# Patient Record
Sex: Female | Born: 2000 | Race: White | Hispanic: No | Smoking: Former smoker
Health system: Southern US, Community
[De-identification: ages and names within clinical notes are randomized; demographics above are authoritative.]

## PROBLEM LIST (undated history)

## (undated) DIAGNOSIS — F329 Major depressive disorder, single episode, unspecified: Secondary | ICD-10-CM

## (undated) DIAGNOSIS — F419 Anxiety disorder, unspecified: Secondary | ICD-10-CM

## (undated) DIAGNOSIS — F32A Depression, unspecified: Secondary | ICD-10-CM

## (undated) DIAGNOSIS — R42 Dizziness and giddiness: Secondary | ICD-10-CM

## (undated) HISTORY — DX: Dizziness and giddiness: R42

---
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
## 2018-02-15 ENCOUNTER — Other Ambulatory Visit: Payer: Self-pay

## 2018-02-15 ENCOUNTER — Encounter (HOSPITAL_BASED_OUTPATIENT_CLINIC_OR_DEPARTMENT_OTHER): Payer: Self-pay | Admitting: Emergency Medicine

## 2018-02-15 DIAGNOSIS — Z79899 Other long term (current) drug therapy: Secondary | ICD-10-CM | POA: Diagnosis not present

## 2018-02-15 DIAGNOSIS — R1110 Vomiting, unspecified: Secondary | ICD-10-CM | POA: Insufficient documentation

## 2018-02-15 DIAGNOSIS — R42 Dizziness and giddiness: Secondary | ICD-10-CM | POA: Insufficient documentation

## 2018-02-15 DIAGNOSIS — N946 Dysmenorrhea, unspecified: Secondary | ICD-10-CM | POA: Insufficient documentation

## 2018-02-15 DIAGNOSIS — F17210 Nicotine dependence, cigarettes, uncomplicated: Secondary | ICD-10-CM | POA: Diagnosis not present

## 2018-02-15 DIAGNOSIS — N939 Abnormal uterine and vaginal bleeding, unspecified: Secondary | ICD-10-CM | POA: Diagnosis present

## 2018-02-15 DIAGNOSIS — R109 Unspecified abdominal pain: Secondary | ICD-10-CM | POA: Diagnosis not present

## 2018-02-15 NOTE — ED Triage Notes (Signed)
Pt presents with c/o worse than normal cramps and blood clots.

## 2018-02-16 ENCOUNTER — Emergency Department (HOSPITAL_BASED_OUTPATIENT_CLINIC_OR_DEPARTMENT_OTHER)
Admission: EM | Admit: 2018-02-16 | Discharge: 2018-02-16 | Disposition: A | Discharge status: Home / Self Care | Payer: Medicaid Other | Attending: Emergency Medicine | Admitting: Emergency Medicine

## 2018-02-16 DIAGNOSIS — N946 Dysmenorrhea, unspecified: Secondary | ICD-10-CM

## 2018-02-16 LAB — WET PREP, GENITAL
Clue Cells Wet Prep HPF POC: NONE SEEN
Sperm: NONE SEEN
Trich, Wet Prep: NONE SEEN
Yeast Wet Prep HPF POC: NONE SEEN

## 2018-02-16 LAB — PREGNANCY, URINE: PREG TEST UR: NEGATIVE

## 2018-02-16 MED ORDER — ONDANSETRON 4 MG PO TBDP: 4.0000 mg | ORAL_TABLET | Freq: Four times a day (QID) | ORAL | 0 refills | Status: DC | PRN

## 2018-02-16 MED ORDER — ONDANSETRON 4 MG PO TBDP
4.0000 mg | ORAL_TABLET | Freq: Once | ORAL | Status: DC
  Filled 2018-02-16: qty 1

## 2018-02-16 MED ORDER — IBUPROFEN 800 MG PO TABS
800.0000 mg | ORAL_TABLET | Freq: Once | ORAL | Status: AC
  Administered 2018-02-16: 800 mg via ORAL
  Filled 2018-02-16: qty 1

## 2018-02-16 NOTE — ED Provider Notes (Signed)
TIME SEEN: 1:12 AM  CHIEF COMPLAINT: Vaginal bleeding  HPI: Patient is an 17 year old female who presents emergency department with heavy vaginal bleeding and cramps.  States that she had a Nex[facility]on placed about a year ago [facility] and since that time has had very regular menstrual cycles.  States at times her menstrual cycles very light and will last only a day and then other days it is heavier and she is passing clots with lots of cramping.  She states that she began bleeding several days ago and started passing clots last night at work and felt lightheaded and had one episode of vomiting.  States she is having diffuse lower abdominal cramps.  Took 1 Excedrin without much relief.  She is sexually active.  No history of STDs or pregnancies.  Denies any vaginal discharge.  No dysuria.  No fever.  ROS: See HPI Constitutional: no fever  Eyes: no drainage  ENT: no runny nose   Cardiovascular:  no chest pain  Resp: no SOB  GI: no vomiting GU: no dysuria Integumentary: no rash  Allergy: no hives  Musculoskeletal: no leg swelling  Neurological: no slurred speech ROS otherwise negative  PAST MEDICAL HISTORY/PAST SURGICAL HISTORY:  Past Medical History:  Diagnosis Date  . Anxiety   . Depression     MEDICATIONS:  Prior to Admission medications   Medication Sig Start Date End Date Taking? Authorizing Provider  bacitracin ointment Apply 1 application topically 3 (three) times daily. Patient not taking: Reported on 11/29/2017 08/25/17   Everlene Farrieransie, William, PA-C  Etonogestrel [facility](IMPLANON [place]) Inject into the skin.    [provider]  naproxen (NAPROSYN) 250 MG tablet Take 1 tablet (250 mg total) by mouth 2 (two) times daily with a meal. Patient not taking: Reported on 11/29/2017 08/25/17   Everlene Farrieransie, William, PA-C  phenazopyridine (PYRIDIUM) 95 MG tablet Take 1 tablet (95 mg total) by mouth 3 (three) times daily as needed for pain. 11/29/17   Khatri, Hina, PA-C  sertraline (ZOLOFT)  50 MG tablet Take 50 mg by mouth every morning.     [provider]    ALLERGIES:  Allergies  Allergen Reactions  . Pineapple     SOCIAL HISTORY:  Social History   Tobacco Use  . Smoking status: Current Some Day Smoker    Types: Cigarettes  . Smokeless tobacco: Never Used  Substance Use Topics  . Alcohol use: No    FAMILY HISTORY: No family history on file.  EXAM: BP 109/85 (BP Location: Left Arm)   Pulse 84   Temp 97.9 F (36.6 C) (Oral)   Resp 20   Ht 5\' 8"  (1.727 m)   Wt 61.2 kg (135 lb)   LMP 02/14/2018   SpO2 100%   BMI 20.53 kg/m  CONSTITUTIONAL: Alert and oriented and responds appropriately to questions. Well-appearing; well-nourished, smiling, laughing, afebrile, nontoxic HEAD: Normocephalic EYES: Conjunctivae clear, pupils appear equal, EOMI ENT: normal nose; moist mucous membranes NECK: Supple, no meningismus, no nuchal rigidity, no LAD  CARD: RRR; S1 and S2 appreciated; no murmurs, no clicks, no rubs, no gallops RESP: Normal chest excursion without splinting or tachypnea; breath sounds clear and equal bilaterally; no wheezes, no rhonchi, no rales, no hypoxia or respiratory distress, speaking full sentences ABD/GI: Normal bowel sounds; non-distended; soft, non-tender, no rebound, no guarding, no peritoneal signs, no hepatosplenomegaly GU:  Normal external genitalia. No lesions, rashes noted. Patient has moderate amount of dark red vaginal bleeding on exam but no clots appreciated or  hemorrhaging.  No vaginal discharge.  No adnexal tenderness, mass or fullness, no cervical motion tenderness. Cervix is not appear friable.  Cervix is closed.  Chaperone present for exam. BACK:  The back appears normal and is non-tender to palpation, there is no CVA tenderness EXT: Normal ROM in all joints; non-tender to palpation; no edema; normal capillary refill; no cyanosis, no calf tenderness or swelling    SKIN: Normal color for age and race; warm; no rash NEURO:  Moves all extremities equally PSYCH: The patient's mood and manner are appropriate. Grooming and personal hygiene are appropriate.  MEDICAL DECISION MAKING: Pt here with dysmenorrhea.  I suspect that this is her menstrual cycle which has been irregular since being on Nexplanon.  I recommended alternating Tylenol and Motrin.  No cervical motion tenderness or adnexal tenderness on exam.  Have low suspicion for torsion, PID, TOA.  Doubt appendicitis.  Her pregnancy test here is negative.  Wet prep is also unremarkable.  GC and chlamydia pending but at this time nothing to suggest STDs.  I feel she is safe to be discharged without further emergent workup and have given her outpatient OB/GYN follow-up.  Patient is comfortable with this plan.  Given ibuprofen here and states she is feeling much better.    At this time, I do not feel there is any life-threatening condition present. I have reviewed and discussed all results (EKG, imaging, lab, urine as appropriate) and exam findings with patient/family. I have reviewed nursing notes and appropriate previous records.  I feel the patient is safe to be discharged home without further emergent workup and can continue workup as an outpatient as needed. Discussed usual and customary return precautions. Patient/family verbalize understanding and are comfortable with this plan.  Outpatient follow-up has been provided if needed. All questions have been answered.     Bevin Das, Layla Maw, DO 02/16/18 [id number]

## 2018-02-16 NOTE — ED Notes (Signed)
ED Provider at bedside. 

## 2018-02-16 NOTE — Discharge Instructions (Signed)
You may alternate Tylenol 1000 mg every 6 hours as needed for pain and Ibuprofen 800 mg every 8 hours as needed for pain.  Please take Ibuprofen with food. ° ° ° ° °[facility] °[email or web address] °[address] °[place], [place] °[phone]  ° ° °[facility] °[email or web address] °[address] °[place], [place] °[phone]  ° ° °[facility] °[address] °[place], [place] °[phone]  ° °[facility] °[email or web address] °[address] °[place], [place] °[phone]  ° °[facility] °[email or web address] °[address] °[place], [place] °[phone]  ° °[facility] °[email or web address] °[address] °[place],  °[phone] ° ° °

## 2018-02-16 NOTE — ED Notes (Signed)
Patient is resting comfortably. 

## 2018-02-18 LAB — GC/CHLAMYDIA PROBE AMP (~~LOC~~) NOT AT ARMC
CHLAMYDIA, DNA PROBE: POSITIVE — AB
Neisseria Gonorrhea: NEGATIVE

## 2018-02-19 ENCOUNTER — Telehealth: Payer: Self-pay | Admitting: Student

## 2018-02-19 DIAGNOSIS — A749 Chlamydial infection, unspecified: Secondary | ICD-10-CM

## 2018-02-19 MED ORDER — AZITHROMYCIN 500 MG PO TABS: 1000.0000 mg | ORAL_TABLET | Freq: Once | ORAL | 0 refills | Status: AC

## 2018-02-19 NOTE — Telephone Encounter (Addendum)
Wanda RangeIsabella Houston tested positive for  Chlamydia. Patient was called by Wanda Houston and allergies and pharmacy confirmed. Rx sent to pharmacy of choice.   Wanda HornLawrence, Wanda Markham, NP 02/19/2018 5:35 PM        ----- Message from Wanda BectonLori Houston Berdik, Wanda Houston sent at 02/19/2018  4:05 PM EST ----- This patient tested positive for :  Chlamydia  She is allergic to:" Pineapple",  I have informed the patient of her results and confirmed her pharmacy is correct in her chart. Please send Rx.   Thank you,   Wanda BectonBerdik, Wanda Houston, Wanda Houston   [facility].
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
## 2018-03-29 ENCOUNTER — Other Ambulatory Visit: Payer: Self-pay

## 2018-03-29 ENCOUNTER — Encounter (HOSPITAL_COMMUNITY): Payer: Self-pay | Admitting: Emergency Medicine

## 2018-03-29 ENCOUNTER — Emergency Department (HOSPITAL_COMMUNITY)
Admission: EM | Admit: 2018-03-29 | Discharge: 2018-03-30 | Disposition: A | Discharge status: Home / Self Care | Payer: Medicaid Other | Attending: Emergency Medicine | Admitting: Emergency Medicine

## 2018-03-29 DIAGNOSIS — J029 Acute pharyngitis, unspecified: Secondary | ICD-10-CM | POA: Insufficient documentation

## 2018-03-29 DIAGNOSIS — F17210 Nicotine dependence, cigarettes, uncomplicated: Secondary | ICD-10-CM | POA: Diagnosis not present

## 2018-03-29 DIAGNOSIS — Z79899 Other long term (current) drug therapy: Secondary | ICD-10-CM | POA: Diagnosis not present

## 2018-03-29 LAB — GROUP A STREP BY PCR: GROUP A STREP BY PCR: NOT DETECTED

## 2018-03-29 MED ORDER — PENICILLIN G BENZATHINE & PROC 1200000 UNIT/2ML IM SUSP
1.2000 10*6.[IU] | Freq: Once | INTRAMUSCULAR | Status: AC
  Administered 2018-03-30: 1.2 10*6.[IU] via INTRAMUSCULAR
  Filled 2018-03-29: qty 2

## 2018-03-29 NOTE — ED Triage Notes (Signed)
C/o sore throat since this morning.  States her brother that lives with her was recently diagnosed with strep.

## 2018-03-30 NOTE — ED Provider Notes (Signed)
[facility] EMERGENCY DEPARTMENT Provider Note   CSN: [id number] Arrival date & time: 03/29/18  2158     History   Chief Complaint Chief Complaint  Patient presents with  . Sore Throat    HPI Wanda Houston is a 17 y.o. female.  HPI   Patient is an 17 year old female who is presenting to the ED today complaining of a sore throat that began earlier this morning.  States pain initially was not severe however it has been gradually worsening all day.  Pain is worse when she swallows.  Pain is 5/10 at rest and 8/10 when she swallows.  She has not tried taking any medications for the pain.  She also reports associated chills, but has not had a documented fever at home.  She has a chronic cough which she states is not worse today.  She has no shortness of breath or chest pain.  She has some bilateral ear fullness but no significant pain.  No rhinorrhea or congestion.  No abdominal pain, nausea, vomiting, diarrhea.  No other symptoms.  States that she lives with a roommate who was recently diagnosed with strep.  Past Medical History:  Diagnosis Date  . Anxiety   . Depression     There are no active problems to display for this patient.   History reviewed. No pertinent surgical history.   OB History   None      Home Medications    Prior to Admission medications   Medication Sig Start Date End Date Taking? Authorizing Provider  bacitracin ointment Apply 1 application topically 3 (three) times daily. Patient not taking: Reported on 11/29/2017 08/25/17   Everlene Farrier, PA-C  Etonogestrel [facility]) Inject into the skin.    [provider]  naproxen (NAPROSYN) 250 MG tablet Take 1 tablet (250 mg total) by mouth 2 (two) times daily with a meal. Patient not taking: Reported on 11/29/2017 08/25/17   Everlene Farrier, PA-C  ondansetron (ZOFRAN ODT) 4 MG disintegrating tablet Take 1 tablet (4 mg total) by mouth every 6 (six) hours as needed for nausea or  vomiting. 02/16/18   Ward, Layla Maw, DO  phenazopyridine (PYRIDIUM) 95 MG tablet Take 1 tablet (95 mg total) by mouth 3 (three) times daily as needed for pain. 11/29/17   Khatri, Hina, PA-C  sertraline (ZOLOFT) 50 MG tablet Take 50 mg by mouth every morning.     [provider]    Family History No family history on file.  Social History Social History   Tobacco Use  . Smoking status: Current Some Day Smoker    Types: Cigarettes  . Smokeless tobacco: Never Used  Substance Use Topics  . Alcohol use: No  . Drug use: No     Allergies   Pineapple   Review of Systems Review of Systems  Constitutional: Positive for chills. Negative for fever.  HENT: Positive for sore throat. Negative for congestion, rhinorrhea and trouble swallowing.   Eyes: Negative for visual disturbance.  Respiratory: Positive for cough. Negative for shortness of breath.   Cardiovascular: Negative for chest pain.  Gastrointestinal: Negative for abdominal pain, constipation, diarrhea, nausea and vomiting.  Genitourinary: Negative for dysuria and frequency.  Musculoskeletal: Negative for back pain.  Skin: Negative for wound.  Neurological: Negative for headaches.     Physical Exam Updated Vital Signs BP 117/72 (BP Location: Right Arm)   Pulse 96   Temp 98.1 F (36.7 C) (Oral)   Resp 16   LMP 03/15/2018  SpO2 99%   Physical Exam  Constitutional: She appears well-developed and well-nourished. No distress.  HENT:  Head: Normocephalic and atraumatic.  Bilateral TMs are without erythema or effusion.  There is pharyngeal erythema with mild tonsillar edema.  No tonsillar kissing.  No tonsillar exudates observed.  Uvula is midline and patient has a normal voice.  She is tolerating secretions with a patent airway.  There is no evidence of PTA or retropharyngeal abscess.  Eyes: Conjunctivae are normal.  Neck: Normal range of motion. Neck supple.  Cardiovascular: Normal rate, regular rhythm and  normal heart sounds.  No murmur heard. Pulmonary/Chest: Effort normal and breath sounds normal. No respiratory distress. She has no wheezes.  Abdominal: Soft. Bowel sounds are normal. She exhibits no distension. There is no tenderness.  Musculoskeletal: She exhibits no edema.  Lymphadenopathy:    She has cervical adenopathy.  Neurological: She is alert.  Skin: Skin is warm and dry. Capillary refill takes less than 2 seconds.  Psychiatric: She has a normal mood and affect.  Nursing note and vitals reviewed.    ED Treatments / Results  Labs (all labs ordered are listed, but only abnormal results are displayed) Labs Reviewed  GROUP A STREP BY PCR    EKG None  Radiology No results found.  Procedures Procedures (including critical care time)  Medications Ordered in ED Medications  penicillin g procaine-penicillin g benzathine (BICILLIN-CR) injection 600000-600000 units (has no administration in time range)     Initial Impression / Assessment and Plan / ED Course  I have reviewed the triage vital signs and the nursing notes.  Pertinent labs & imaging results that were available during my care of the patient were reviewed by me and considered in my medical decision making (see chart for details).     Final Clinical Impressions(s) / ED Diagnoses   Final diagnoses:  Pharyngitis, unspecified etiology   Pt febrile with tonsillar edema, cervical lymphadenopathy, & dysphagia.  Her strep test was negative, however she was recently exposed to someone with strep and given her tonsillar edema will treat for suspected strep throat.  She was given penicillin IM in the ED.  Exam not suggestive of PTA or retropharyngeal abscess. No trismus or uvula deviation. Specific return precautions discussed. Pt able to drink water in ED without difficulty with intact air way. Recommended PCP follow up.  Strict return precautions discussed for any new or worsening symptoms.  All questions answered and  patient and her friend at bedside understand the plan and reasons to return.  ED Discharge Orders    None       Rayne DuCouture, Gaelen Brager S, PA-C 03/30/18 0040    Shaune PollackIsaacs, Cameron, MD 03/30/18 1112

## 2018-03-30 NOTE — Discharge Instructions (Addendum)
Your strep test today was negative however since you were recently exposed to someone who was diagnosed with strep you were treated with penicillin in the emergency department.  You should follow-up with your primary care doctor within 1 week for reevaluation.  If your symptoms are worse in any way or  you start to develop fevers, worsening sore throat, inability to swallow, changes in your voice, or any new or different symptoms then you will need to return to the emergency department immediately.

## 2018-04-17 ENCOUNTER — Other Ambulatory Visit: Payer: Self-pay

## 2018-04-17 ENCOUNTER — Encounter (HOSPITAL_COMMUNITY): Payer: Self-pay

## 2018-04-17 ENCOUNTER — Emergency Department (HOSPITAL_COMMUNITY)
Admission: EM | Admit: 2018-04-17 | Discharge: 2018-04-18 | Discharge status: Against Medical Advice | Payer: Medicaid Other | Attending: Emergency Medicine | Admitting: Emergency Medicine

## 2018-04-17 DIAGNOSIS — Z5321 Procedure and treatment not carried out due to patient leaving prior to being seen by health care provider: Secondary | ICD-10-CM | POA: Insufficient documentation

## 2018-04-17 DIAGNOSIS — M542 Cervicalgia: Secondary | ICD-10-CM | POA: Insufficient documentation

## 2018-04-17 NOTE — ED Triage Notes (Signed)
Pt endorsees neck pain and headache that began this evening while working. Also endorses nausea. NAD, VSS.

## 2018-04-18 NOTE — ED Notes (Signed)
Unable to locate pt in lobby for room  

## 2018-07-08 ENCOUNTER — Emergency Department (HOSPITAL_COMMUNITY)
Admission: EM | Admit: 2018-07-08 | Discharge: 2018-07-09 | Disposition: A | Discharge status: Home / Self Care | Payer: Medicaid Other | Attending: Emergency Medicine | Admitting: Emergency Medicine

## 2018-07-08 ENCOUNTER — Encounter (HOSPITAL_COMMUNITY): Payer: Self-pay

## 2018-07-08 DIAGNOSIS — M62838 Other muscle spasm: Secondary | ICD-10-CM | POA: Diagnosis not present

## 2018-07-08 DIAGNOSIS — M542 Cervicalgia: Secondary | ICD-10-CM | POA: Diagnosis present

## 2018-07-08 DIAGNOSIS — Z79899 Other long term (current) drug therapy: Secondary | ICD-10-CM | POA: Insufficient documentation

## 2018-07-08 NOTE — ED Triage Notes (Signed)
Pt complains of chronic neck pain for over a year, she states that she's been seen for it before and only received naproxen, she states today it seems to be worse

## 2018-07-09 ENCOUNTER — Other Ambulatory Visit: Payer: Self-pay

## 2018-07-09 ENCOUNTER — Emergency Department (HOSPITAL_COMMUNITY): Payer: Medicaid Other

## 2018-07-09 NOTE — Discharge Instructions (Addendum)
You have neck pain, possibly from a cervical strain and/or pinched nerve.  ° °SEEK IMMEDIATE MEDICAL ATTENTION IF: °You develop difficulties swallowing or breathing.  °You have new or worse numbness, weakness, tingling, or movement problems in your arms or legs.  °You develop increasing pain which is uncontrolled with medications.  °You have change in bowel or bladder function, or other concerns. ° ° ° °
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
## 2018-07-22 ENCOUNTER — Ambulatory Visit: Payer: Medicaid Other | Admitting: Family Medicine

## 2018-09-30 ENCOUNTER — Emergency Department (HOSPITAL_COMMUNITY)
Admission: EM | Admit: 2018-09-30 | Discharge: 2018-09-30 | Disposition: A | Discharge status: Home / Self Care | Payer: Medicaid Other | Attending: Emergency Medicine | Admitting: Emergency Medicine

## 2018-09-30 DIAGNOSIS — F17210 Nicotine dependence, cigarettes, uncomplicated: Secondary | ICD-10-CM | POA: Diagnosis not present

## 2018-09-30 DIAGNOSIS — R55 Syncope and collapse: Secondary | ICD-10-CM | POA: Diagnosis present

## 2018-09-30 LAB — URINALYSIS, ROUTINE W REFLEX MICROSCOPIC
Bilirubin Urine: NEGATIVE
Glucose, UA: NEGATIVE mg/dL
Hgb urine dipstick: NEGATIVE
Ketones, ur: NEGATIVE mg/dL
Nitrite: NEGATIVE
PH: 6 (ref 5.0–8.0)
Protein, ur: NEGATIVE mg/dL
SPECIFIC GRAVITY, URINE: 1.02 (ref 1.005–1.030)

## 2018-09-30 LAB — CBC
HCT: 45.8 % (ref 36.0–46.0)
Hemoglobin: 14.8 g/dL (ref 12.0–15.0)
MCH: 28.6 pg (ref 26.0–34.0)
MCHC: 32.3 g/dL (ref 30.0–36.0)
MCV: 88.4 fL (ref 80.0–100.0)
NRBC: 0 % (ref 0.0–0.2)
PLATELETS: 283 10*3/uL (ref 150–400)
RBC: 5.18 MIL/uL — AB (ref 3.87–5.11)
RDW: 12.5 % (ref 11.5–15.5)
WBC: 11.8 10*3/uL — ABNORMAL HIGH (ref 4.0–10.5)

## 2018-09-30 LAB — BASIC METABOLIC PANEL
ANION GAP: 9 (ref 5–15)
BUN: 9 mg/dL (ref 6–20)
CALCIUM: 9.3 mg/dL (ref 8.9–10.3)
CO2: 26 mmol/L (ref 22–32)
Chloride: 106 mmol/L (ref 98–111)
Creatinine, Ser: 0.61 mg/dL (ref 0.44–1.00)
GLUCOSE: 79 mg/dL (ref 70–99)
Potassium: 3.5 mmol/L (ref 3.5–5.1)
Sodium: 141 mmol/L (ref 135–145)

## 2018-09-30 LAB — D-DIMER, QUANTITATIVE: D-Dimer, Quant: <0.27 ug/mL-FEU (ref 0.00–0.50)

## 2018-09-30 LAB — I-STAT BETA HCG BLOOD, ED (MC, WL, AP ONLY)

## 2018-09-30 LAB — CBG MONITORING, ED: Glucose-Capillary: 84 mg/dL (ref 70–99)

## 2018-09-30 MED ORDER — SODIUM CHLORIDE 0.9 % IV BOLUS
1000.0000 mL | Freq: Once | INTRAVENOUS | Status: AC
  Administered 2018-09-30: 1000 mL via INTRAVENOUS
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
## 2018-09-30 NOTE — Discharge Instructions (Signed)

## 2018-09-30 NOTE — ED Notes (Signed)
Bed: [facility] Expected date:  Expected time:  Means of arrival:  Comments: 

## 2018-09-30 NOTE — ED Triage Notes (Signed)
Per EMS- Patient had a panic attack while at work this afternoon.

## 2018-09-30 NOTE — ED Provider Notes (Signed)
Emergency Department Provider Note   I have reviewed the triage vital signs and the nursing notes.   HISTORY  Chief Complaint Anxiety   HPI Wanda Houston is a 17 y.o. female with PMH of anxiety and depression presents to the emergency department for evaluation after syncopal event.  The patient states that she was feeling somewhat lightheaded prior to going to work.  Once at work, she felt worse.  She states she began to have panic attack type symptoms with rapid breathing and sudden rush of anxiety.  She states that a friend she was working with helps her as she reportedly passed out.  She was caught and lowered to the ground without stating any head injury.  Patient denies any chest pain or shortness of breath.  She states she may have had a brief sensation of palpitations.  States this time she is feeling well but did feel somewhat lightheaded when standing. No fevers/chills.    Past Medical History:  Diagnosis Date  . Anxiety   . Depression     There are no active problems to display for this patient.   No past surgical history on file.  Allergies Pineapple  No family history on file.  Social History Social History   Tobacco Use  . Smoking status: Current Every Day Smoker    Packs/day: 0.25    Types: Cigarettes  . Smokeless tobacco: Never Used  Substance Use Topics  . Alcohol use: Yes    Comment: occ  . Drug use: No    Review of Systems  Constitutional: No fever/chills Eyes: No visual changes. ENT: No sore throat. Cardiovascular: Denies chest pain. Positive syncope.  Respiratory: Denies shortness of breath. Gastrointestinal: No abdominal pain.  No nausea, no vomiting.  No diarrhea.  No constipation. Genitourinary: Negative for dysuria. Musculoskeletal: Negative for back pain. Skin: Negative for rash. Neurological: Negative for headaches, focal weakness or numbness.  10-point ROS otherwise  negative.  ____________________________________________   PHYSICAL EXAM:  VITAL SIGNS: ED Triage Vitals  Enc Vitals Group     BP 09/30/18 1634 (!) 116/59     Pulse Rate 09/30/18 1634 95     Resp 09/30/18 1634 17     Temp 09/30/18 1634 98.7 F (37.1 C)     Temp Source 09/30/18 1634 Oral     SpO2 09/30/18 1634 98 %     Weight 09/30/18 1634 130 lb (59 kg)     Height 09/30/18 1634 5\' 8"  (1.727 m)     Pain Score 09/30/18 1711 0    Constitutional: Alert and oriented. Well appearing and in no acute distress. Eyes: Conjunctivae are normal.  Head: Atraumatic. Nose: No congestion/rhinnorhea. Mouth/Throat: Mucous membranes are moist.  Neck: No stridor.   Cardiovascular: Normal rate, regular rhythm. Good peripheral circulation. Grossly normal heart sounds.   Respiratory: Normal respiratory effort.  No retractions. Lungs CTAB. Gastrointestinal: Soft and nontender. No distention.  Musculoskeletal: No lower extremity tenderness nor edema. No gross deformities of extremities. Neurologic:  Normal speech and language. No gross focal neurologic deficits are appreciated.  Skin:  Skin is warm, dry and intact. No rash noted.  ____________________________________________   LABS (all labs ordered are listed, but only abnormal results are displayed)  Labs Reviewed  CBC - Abnormal; Notable for the following components:      Result Value   WBC 11.8 (*)    RBC 5.18 (*)    All other components within normal limits  URINALYSIS, ROUTINE W REFLEX MICROSCOPIC - Abnormal;  Notable for the following components:   APPearance HAZY (*)    Leukocytes, UA SMALL (*)    Bacteria, UA RARE (*)    All other components within normal limits  BASIC METABOLIC PANEL  D-DIMER, QUANTITATIVE (NOT AT [facility])  CBG MONITORING, ED  I-STAT BETA HCG BLOOD, ED ([facility], [facility], [facility] ONLY)   ____________________________________________  EKG   EKG Interpretation  Date/Time:  Monday September 30 2018 17:17:17 EDT Ventricular Rate:   78 PR Interval:    QRS Duration: 89 QT Interval:  360 QTC Calculation: 410 R Axis:   85 Text Interpretation:  Sinus arrhythmia No STEMI.  Confirmed by Alona Bene [phone]) on 09/30/2018 9:31:21 PM       ____________________________________________  RADIOLOGY  None ____________________________________________   PROCEDURES  Procedure(s) performed:   Procedures  None ____________________________________________   INITIAL IMPRESSION / ASSESSMENT AND PLAN / ED COURSE  Pertinent labs & imaging results that were available during my care of the patient were reviewed by me and considered in my medical decision making (see chart for details).  Patient presents to the emergency department for evaluation of syncope. Patient with anxiety symptoms prior to syncope. EKG reviewed with no acute findings. Plan for IVF, orthostatics, and reassess.   Patient does become more tachycardic with standing but no decreased BP. Patient feeling better after IVF. Labs including D-dimer are normal. Plan for PCP and Cardiology follow up.   At this time, I do not feel there is any life-threatening condition present. I have reviewed and discussed all results (EKG, imaging, lab, urine as appropriate), exam findings with patient. I have reviewed nursing notes and appropriate previous records.  I feel the patient is safe to be discharged home without further emergent workup. Discussed usual and customary return precautions. Patient and family (if present) verbalize understanding and are comfortable with this plan.  Patient will follow-up with their primary care provider. If they do not have a primary care provider, information for follow-up has been provided to them. All questions have been answered.  ____________________________________________  FINAL CLINICAL IMPRESSION(S) / ED DIAGNOSES  Final diagnoses:  Syncope and collapse     MEDICATIONS GIVEN DURING THIS VISIT:  Medications  sodium chloride  0.9 % bolus 1,000 mL (0 mLs Intravenous Stopped 09/30/18 2145)     Note:  This document was prepared using Dragon voice recognition software and may include unintentional dictation errors.  Alona Bene, MD Emergency Medicine    Long, Arlyss Repress, MD 10/01/18 [phone]

## 2018-10-04 ENCOUNTER — Other Ambulatory Visit: Payer: Self-pay

## 2018-10-04 ENCOUNTER — Emergency Department (HOSPITAL_COMMUNITY)
Admission: EM | Admit: 2018-10-04 | Discharge: 2018-10-05 | Disposition: A | Discharge status: Home / Self Care | Payer: Medicaid Other | Attending: Emergency Medicine | Admitting: Emergency Medicine

## 2018-10-04 ENCOUNTER — Encounter (HOSPITAL_COMMUNITY): Payer: Self-pay | Admitting: *Deleted

## 2018-10-04 DIAGNOSIS — R079 Chest pain, unspecified: Secondary | ICD-10-CM | POA: Diagnosis not present

## 2018-10-04 DIAGNOSIS — R51 Headache: Secondary | ICD-10-CM | POA: Insufficient documentation

## 2018-10-04 DIAGNOSIS — R42 Dizziness and giddiness: Secondary | ICD-10-CM | POA: Diagnosis not present

## 2018-10-04 DIAGNOSIS — M542 Cervicalgia: Secondary | ICD-10-CM | POA: Insufficient documentation

## 2018-10-04 DIAGNOSIS — F17210 Nicotine dependence, cigarettes, uncomplicated: Secondary | ICD-10-CM | POA: Diagnosis not present

## 2018-10-04 NOTE — ED Triage Notes (Signed)
Pt c/o neck/back pain and blurry vision since today. States also having dizziness for vertigo (dx this week by her doctor). Last took meclizine about 2 hours ago.

## 2018-10-05 NOTE — ED Notes (Signed)
Dr. Nanavati at bedside 

## 2018-10-05 NOTE — ED Notes (Signed)
Pt removed from monitor waiting for discharge papers

## 2018-10-05 NOTE — ED Provider Notes (Signed)
[facility] Provider Note   CSN: [id number] Arrival date & time: 10/04/18  2241     History   Chief Complaint No chief complaint on file.   HPI Mackenzie Groom is a 17 y.o. female.  HPI  17 year old female with history of anxiety and depression comes in with chief complaint of dizziness. Patient reports that prior to ED arrival she started having sudden onset of vertigo with associated chest pain, neck pain and headache.  Patient got nervous and decided to come to the ER.  On her way to the ED she took meclizine, and now she feels a little better.  Patient was diagnosed with vertigo just within the past couple of weeks.  She was prescribed meclizine by her PCP and she has been taking half a tablet every few hours.  Patient denies any hearing loss or ringing in the ear.  She denies any severe headaches or neck pain at the moment.  Patient also denies any trauma.  She is not on any birth control medication and denies any family history of brain aneurysm, brain bleed or brain tumors.  Patient also denies any vision changes, focal numbness, tingling or weakness, slurred speech.  She also denies any substance abuse.  Past Medical History:  Diagnosis Date  . Anxiety   . Depression     There are no active problems to display for this patient.   History reviewed. No pertinent surgical history.   OB History   None      Home Medications    Prior to Admission medications   Medication Sig Start Date End Date Taking? Authorizing Provider  Etonogestrel (IMPLANON [place]) Inject into the skin.    [provider]  ibuprofen (ADVIL,MOTRIN) 200 MG tablet Take 200 mg by mouth daily as needed (pain).    [provider]    Family History No family history on file.  Social History Social History   Tobacco Use  . Smoking status: Current Every Day Smoker    Packs/day: 0.25    Types: Cigarettes  . Smokeless tobacco: Never Used    Substance Use Topics  . Alcohol use: Yes    Comment: occ  . Drug use: No     Allergies   Pineapple   Review of Systems Review of Systems  Constitutional: Positive for activity change.  Respiratory: Positive for shortness of breath.   Cardiovascular: Positive for chest pain.  Gastrointestinal: Negative for nausea and vomiting.  Allergic/Immunologic: Negative for immunocompromised state.  Neurological: Positive for dizziness, light-headedness and headaches.  All other systems reviewed and are negative.    Physical Exam Updated Vital Signs BP 110/61   Pulse 64   Temp 98 F (36.7 C) (Oral)   Resp 18   SpO2 96%   Physical Exam  Constitutional: She is oriented to person, place, and time. She appears well-developed.  HENT:  Head: Normocephalic and atraumatic.  Eyes: Pupils are equal, round, and reactive to light. EOM are normal.  No nystagmus  Neck: Normal range of motion. Neck supple.  Cardiovascular: Normal rate.  Pulmonary/Chest: Effort normal.  Abdominal: Bowel sounds are normal.  Neurological: She is alert and oriented to person, place, and time. No cranial nerve deficit. Coordination normal.  Cerebellar exam is normal (finger to nose) Sensory exam normal for bilateral upper and lower extremities - and patient is able to discriminate between sharp and dull. Motor exam is 4+/5   Skin: Skin is warm and dry.  Nursing note and  vitals reviewed.    ED Treatments / Results  Labs (all labs ordered are listed, but only abnormal results are displayed) Labs Reviewed - No data to display  EKG None  Radiology No results found.  Procedures Procedures (including critical care time)  Medications Ordered in ED Medications - No data to display   Initial Impression / Assessment and Plan / ED Course  I have reviewed the triage vital signs and the nursing notes.  Pertinent labs & imaging results that were available during my care of the patient were reviewed by me  and considered in my medical decision making (see chart for details).     17 year old female comes in with chief complaint of dizziness. She also had chest pain, headache and neck pain.  Patient has history of anxiety and was recently diagnosed with vertigo.  On my exam she has no focal neurologic deficit.  She also does not have any nystagmus. Family history and personal history does not reveal any red flags for brain aneurysm, dissection or bleed.  Plan is to continue with conservative management of meclizine.  We will also give her Apley maneuver exercise that she can start doing.  Patient has a follow-up with her PCP next week, if her symptoms persist then she might need to get neurology or ENT referral.  Patient has been made aware of this plan and she agrees.  Strict ER return precautions have been discussed.  Final Clinical Impressions(s) / ED Diagnoses   Final diagnoses:  Vertigo    ED Discharge Orders    None       Derwood Kaplan, MD 10/05/18 [phone]

## 2018-10-05 NOTE — Discharge Instructions (Addendum)
We believe that your vertigo is because of inner ear fluid issue. Continue taking meclizine as prescribed and do the Epley maneuvers.  See her doctor next week as planned.  If you continue to not do well then you might need to be referred to a specialist for further care.  Return to the ER if you start having worsening of your symptoms are severe headaches/neck pain.

## 2018-10-21 ENCOUNTER — Encounter: Payer: Self-pay | Admitting: Obstetrics & Gynecology

## 2018-10-21 ENCOUNTER — Ambulatory Visit (INDEPENDENT_AMBULATORY_CARE_PROVIDER_SITE_OTHER): Payer: Medicaid Other | Admitting: Obstetrics & Gynecology

## 2018-10-21 ENCOUNTER — Other Ambulatory Visit (HOSPITAL_COMMUNITY)
Admission: RE | Admit: 2018-10-21 | Discharge: 2018-10-21 | Disposition: A | Discharge status: Home / Self Care | Payer: Medicaid Other | Source: Ambulatory Visit | Attending: Obstetrics & Gynecology | Admitting: Obstetrics & Gynecology

## 2018-10-21 VITALS — BP 100/57 | HR 81 | Ht 68.0 in | Wt 137.0 lb

## 2018-10-21 DIAGNOSIS — N921 Excessive and frequent menstruation with irregular cycle: Secondary | ICD-10-CM

## 2018-10-21 DIAGNOSIS — Z0000 Encounter for general adult medical examination without abnormal findings: Secondary | ICD-10-CM

## 2018-10-21 DIAGNOSIS — Z01419 Encounter for gynecological examination (general) (routine) without abnormal findings: Secondary | ICD-10-CM

## 2018-10-21 DIAGNOSIS — N939 Abnormal uterine and vaginal bleeding, unspecified: Secondary | ICD-10-CM | POA: Diagnosis present

## 2018-10-21 DIAGNOSIS — Z975 Presence of (intrauterine) contraceptive device: Secondary | ICD-10-CM

## 2018-10-21 MED ORDER — NORETHIN ACE-ETH ESTRAD-FE 1-20 MG-MCG(24) PO TABS: 1.0000 | ORAL_TABLET | Freq: Every day | ORAL | 3 refills | Status: DC

## 2018-10-21 NOTE — Progress Notes (Signed)
Subjective:     Wanda Houston is a 16 y.o. female here for a routine exam. G0 LMP 10/20/2018 also had a cycle 2 weeks prev. Current complaints: heavy cycles. Has a Nexplanon that was placed 1 year prev.  Menses have never bee n regular with the Nexplanon. Pt works and is in a band.  Sexually active- females. Last screen for STIs in Feb. Got the Nexplanon but, does not feel the need to have it currently. Had regular but, heavy cycles prior to the Nexplanon.   Gynecologic History LMP  10/21/2018 Contraception: Nexplanon Last Pap: n/a Last mammogram: n/a  Obstetric History OB History  Gravida Para Term Preterm AB Living  0 0 0 0 0 0  SAB TAB Ectopic Multiple Live Births  0 0 0 0 0   The following portions of the patient's history were reviewed and updated as appropriate: allergies, current medications, past family history, past medical history, past social history, past surgical history and problem list.  Review of Systems Pertinent items are noted in HPI.    Objective:  BP (!) 100/57   Pulse 81   Ht 5\' 8"  (1.727 m)   Wt 137 lb 0.6 oz (62.2 kg)   BMI 20.84 kg/m      General Appearance:    Alert, cooperative, no distress, appears stated age  Head:    Normocephalic, without obvious abnormality, atraumatic  Eyes:    conjunctiva/corneas clear, EOM's intact, both eyes  Ears:    Normal external ear canals, both ears  Nose:   Nares normal, septum midline, mucosa normal, no drainage    or sinus tenderness  Throat:   Lips, mucosa, and tongue normal; teeth and gums normal  Neck:   Supple, symmetrical, trachea midline, no adenopathy;    thyroid:  no enlargement/tenderness/nodules  Back:     Symmetric, no curvature, ROM normal, no CVA tenderness  Lungs:     Clear to auscultation bilaterally, respirations unlabored  Chest Wall:    No tenderness or deformity   Heart:    Regular rate and rhythm, S1 and S2 normal, no murmur, rub   or gallop  Breast Exam:    No tenderness, masses, or nipple  abnormality  Abdomen:     Soft, non-tender, bowel sounds active all four quadrants,    no masses, no organomegaly  Genitalia:    Normal female without lesion, discharge or tenderness     Extremities:   Extremities normal, atraumatic, no cyanosis or edema  Pulses:   2+ and symmetric all extremities  Skin:   Skin color, texture, turgor normal, no rashes or lesions    Assessment:    Healthy female exam.   Breakthrough bleeding on Nexplanon- pt has not tried any meds to help with bleeding.  She is willing to try OCP{s but, if they do not work, she desires removal of the  Nexplanon.   She is not currently sexually active with males but, her last STI screen was in Feb.    Plan:    Follow up in: 6 weeks.  for pill check or removal of the Nexplanon.  F/u cx LoEstrin 1/20 1 po q day for 6 weeks. Do not take inactive pills.  If bleeding returns to normal, pt to f/u in 1 year or sooner prn  Jade Burright L. Harraway-Smith, M.D., [place]

## 2018-10-21 NOTE — Addendum Note (Signed)
Addended by: Lorelle Gibbs L on: 10/21/2018 09:51 AM   Modules accepted: Orders

## 2018-10-21 NOTE — Patient Instructions (Signed)
Etonogestrel implant What is this medicine? ETONOGESTREL (et oh noe JES trel) is a contraceptive (birth control) device. It is used to prevent pregnancy. It can be used for up to 3 years. This medicine may be used for other purposes; ask your health care provider or pharmacist if you have questions. COMMON BRAND NAME(S): Implanon, Nexplanon What should I tell my health care provider before I take this medicine? They need to know if you have any of these conditions: -abnormal vaginal bleeding -blood vessel disease or blood clots -cancer of the breast, cervix, or liver -depression -diabetes -gallbladder disease -headaches -heart disease or recent heart attack -high blood pressure -high cholesterol -kidney disease -liver disease -renal disease -seizures -tobacco smoker -an unusual or allergic reaction to etonogestrel, other hormones, anesthetics or antiseptics, medicines, foods, dyes, or preservatives -pregnant or trying to get pregnant -breast-feeding How should I use this medicine? This device is inserted just under the skin on the inner side of your upper arm by a health care professional. Talk to your pediatrician regarding the use of this medicine in children. Special care may be needed. Overdosage: If you think you have taken too much of this medicine contact a poison control center or emergency room at once. NOTE: This medicine is only for you. Do not share this medicine with others. What if I miss a dose? This does not apply. What may interact with this medicine? Do not take this medicine with any of the following medications: -amprenavir -bosentan -fosamprenavir This medicine may also interact with the following medications: -barbiturate medicines for inducing sleep or treating seizures -certain medicines for fungal infections like ketoconazole and itraconazole -grapefruit juice -griseofulvin -medicines to treat seizures like carbamazepine, felbamate, oxcarbazepine,  phenytoin, topiramate -modafinil -phenylbutazone -rifampin -rufinamide -some medicines to treat HIV infection like atazanavir, indinavir, lopinavir, nelfinavir, tipranavir, ritonavir -St. John's wort This list may not describe all possible interactions. Give your health care provider a list of all the medicines, herbs, non-prescription drugs, or dietary supplements you use. Also tell them if you smoke, drink alcohol, or use illegal drugs. Some items may interact with your medicine. What should I watch for while using this medicine? This product does not protect you against HIV infection (AIDS) or other sexually transmitted diseases. You should be able to feel the implant by pressing your fingertips over the skin where it was inserted. Contact your doctor if you cannot feel the implant, and use a non-hormonal birth control method (such as condoms) until your doctor confirms that the implant is in place. If you feel that the implant may have broken or become bent while in your arm, contact your healthcare provider. What side effects may I notice from receiving this medicine? Side effects that you should report to your doctor or health care professional as soon as possible: -allergic reactions like skin rash, itching or hives, swelling of the face, lips, or tongue -breast lumps -changes in emotions or moods -depressed mood -heavy or prolonged menstrual bleeding -pain, irritation, swelling, or bruising at the insertion site -scar at site of insertion -signs of infection at the insertion site such as fever, and skin redness, pain or discharge -signs of pregnancy -signs and symptoms of a blood clot such as breathing problems; changes in vision; chest pain; severe, sudden headache; pain, swelling, warmth in the leg; trouble speaking; sudden numbness or weakness of the face, arm or leg -signs and symptoms of liver injury like dark yellow or brown urine; general ill feeling or flu-like symptoms;  light-colored   stools; loss of appetite; nausea; right upper belly pain; unusually weak or tired; yellowing of the eyes or skin -unusual vaginal bleeding, discharge -signs and symptoms of a stroke like changes in vision; confusion; trouble speaking or understanding; severe headaches; sudden numbness or weakness of the face, arm or leg; trouble walking; dizziness; loss of balance or coordination Side effects that usually do not require medical attention (report to your doctor or health care professional if they continue or are bothersome): -acne -back pain -breast pain -changes in weight -dizziness -general ill feeling or flu-like symptoms -headache -irregular menstrual bleeding -nausea -sore throat -vaginal irritation or inflammation This list may not describe all possible side effects. Call your doctor for medical advice about side effects. You may report side effects to FDA at 1-800-FDA-1088. Where should I keep my medicine? This drug is given in a hospital or clinic and will not be stored at home. NOTE: This sheet is a summary. It may not cover all possible information. If you have questions about this medicine, talk to your doctor, pharmacist, or health care provider.  2018 Elsevier/Gold Standard (2016-06-22 11:19:22)  

## 2018-10-22 LAB — CERVICOVAGINAL ANCILLARY ONLY
Bacterial vaginitis: NEGATIVE
Candida vaginitis: NEGATIVE
Chlamydia: NEGATIVE
NEISSERIA GONORRHEA: NEGATIVE
TRICH (WINDOWPATH): NEGATIVE

## 2018-11-29 ENCOUNTER — Ambulatory Visit: Payer: Medicaid Other | Admitting: Obstetrics & Gynecology

## 2018-12-13 ENCOUNTER — Emergency Department (HOSPITAL_COMMUNITY): Payer: Medicaid Other

## 2018-12-13 ENCOUNTER — Other Ambulatory Visit: Payer: Self-pay

## 2018-12-13 ENCOUNTER — Encounter (HOSPITAL_COMMUNITY): Payer: Self-pay | Admitting: *Deleted

## 2018-12-13 ENCOUNTER — Emergency Department (HOSPITAL_COMMUNITY)
Admission: EM | Admit: 2018-12-13 | Discharge: 2018-12-13 | Disposition: A | Discharge status: Home / Self Care | Payer: Medicaid Other | Attending: Emergency Medicine | Admitting: Emergency Medicine

## 2018-12-13 DIAGNOSIS — F17210 Nicotine dependence, cigarettes, uncomplicated: Secondary | ICD-10-CM | POA: Diagnosis not present

## 2018-12-13 DIAGNOSIS — Z79899 Other long term (current) drug therapy: Secondary | ICD-10-CM | POA: Diagnosis not present

## 2018-12-13 DIAGNOSIS — R109 Unspecified abdominal pain: Secondary | ICD-10-CM

## 2018-12-13 DIAGNOSIS — R0789 Other chest pain: Secondary | ICD-10-CM | POA: Insufficient documentation

## 2018-12-13 DIAGNOSIS — R1013 Epigastric pain: Secondary | ICD-10-CM | POA: Insufficient documentation

## 2018-12-13 LAB — CBC
HCT: 43.1 % (ref 36.0–46.0)
Hemoglobin: 14 g/dL (ref 12.0–15.0)
MCH: 29.5 pg (ref 26.0–34.0)
MCHC: 32.5 g/dL (ref 30.0–36.0)
MCV: 90.7 fL (ref 80.0–100.0)
Platelets: 255 10*3/uL (ref 150–400)
RBC: 4.75 MIL/uL (ref 3.87–5.11)
RDW: 12.2 % (ref 11.5–15.5)
WBC: 9.8 10*3/uL (ref 4.0–10.5)
nRBC: 0 % (ref 0.0–0.2)

## 2018-12-13 LAB — URINALYSIS, ROUTINE W REFLEX MICROSCOPIC
BACTERIA UA: NONE SEEN
Bilirubin Urine: NEGATIVE
GLUCOSE, UA: NEGATIVE mg/dL
Ketones, ur: 5 mg/dL — AB
Leukocytes, UA: NEGATIVE
Nitrite: NEGATIVE
Protein, ur: NEGATIVE mg/dL
Specific Gravity, Urine: 1.026 (ref 1.005–1.030)
pH: 6 (ref 5.0–8.0)

## 2018-12-13 LAB — COMPREHENSIVE METABOLIC PANEL
ALK PHOS: 48 U/L (ref 38–126)
ALT: 18 U/L (ref 0–44)
AST: 20 U/L (ref 15–41)
Albumin: 4.7 g/dL (ref 3.5–5.0)
Anion gap: 9 (ref 5–15)
BILIRUBIN TOTAL: 0.6 mg/dL (ref 0.3–1.2)
BUN: 13 mg/dL (ref 6–20)
CO2: 27 mmol/L (ref 22–32)
CREATININE: 0.73 mg/dL (ref 0.44–1.00)
Calcium: 9.5 mg/dL (ref 8.9–10.3)
Chloride: 104 mmol/L (ref 98–111)
GFR calc Af Amer: >60 mL/min (ref >60)
GFR calc non Af Amer: >60 mL/min (ref >60)
GLUCOSE: 122 mg/dL — AB (ref 70–99)
POTASSIUM: 3.9 mmol/L (ref 3.5–5.1)
Sodium: 140 mmol/L (ref 135–145)
TOTAL PROTEIN: 7.9 g/dL (ref 6.5–8.1)

## 2018-12-13 LAB — I-STAT TROPONIN, ED: Troponin i, poc: 0 ng/mL (ref 0.00–0.08)

## 2018-12-13 LAB — POC URINE PREG, ED: PREG TEST UR: NEGATIVE

## 2018-12-13 LAB — LIPASE, BLOOD: Lipase: 33 U/L (ref 11–51)

## 2018-12-13 MED ORDER — LIDOCAINE VISCOUS HCL 2 % MT SOLN
15.0000 mL | Freq: Once | OROMUCOSAL | Status: AC
  Administered 2018-12-13: 15 mL via ORAL
  Filled 2018-12-13: qty 15

## 2018-12-13 MED ORDER — ALUM & MAG HYDROXIDE-SIMETH 200-200-20 MG/5ML PO SUSP
30.0000 mL | Freq: Once | ORAL | Status: AC
  Administered 2018-12-13: 30 mL via ORAL
  Filled 2018-12-13: qty 30

## 2018-12-13 MED ORDER — FAMOTIDINE 20 MG PO TABS: 20.0000 mg | ORAL_TABLET | Freq: Two times a day (BID) | ORAL | 0 refills | Status: DC

## 2018-12-13 NOTE — ED Provider Notes (Addendum)
[facility]-EMERGENCY DEPT Provider Note   CSN: [id number] Arrival date & time: 12/13/18  1658   History   Chief Complaint Chief Complaint  Patient presents with  . Abdominal Pain    HPI Wanda Houston is a 17 y.o. female with past medical history significant for depression, anxiety who presents for evaluation of abdominal pain.  Patient states she has had pain to her epigastric and left upper quadrant region x3 days.  Pain is intermittent in nature.  Denies any associated aggravating or alleviating factors.  Denies nausea, vomiting, diarrhea, fever, chills, shortness of breath, cough, hemoptysis, dysuria, diarrhea, constipation, vaginal discharge.  Rates her pain a 6/10.  Pain does not radiate.  Pain is not worse with food intake. Pain is worse with palpation.  She is been able to tolerate p.o. intake in department.  Patient states she has had intermittent chest pain located to the left side of her chest.  Rates his pain a 5/10. Denies radiation of pain, patient is not exertional in nature, denies dizziness, lightheadedness, diaphoresis.  Has not taken anything for symptoms. States the pain occur when she presses on her chest. HSA had similar symptoms previously. Denis hx HTN, Hypercholesterolemia, DM, family hx of MI at an early age. Denies exogenous hormone use, recent surgeries, recent immobilization, long travel, lower extremity swelling, redness or pain. Denis hx of DVT or PE.  History obtained from patient.  No interpreter was used.  HPI  Past Medical History:  Diagnosis Date  . Anxiety   . Depression   . Vertigo     There are no active problems to display for this patient.   History reviewed. No pertinent surgical history.   OB History    Gravida  0   Para  0   Term  0   Preterm  0   AB  0   Living  0     SAB  0   TAB  0   Ectopic  0   Multiple  0   Live Births  0            Home Medications    Prior to Admission  medications   Medication Sig Start Date End Date Taking? Authorizing Provider  cetirizine (ZYRTEC) 10 MG tablet Take 10 mg by mouth daily.   Yes [provider]  Etonogestrel (IMPLANON [place]) Inject into the skin.   Yes [provider]  fluticasone (FLONASE) 50 MCG/ACT nasal spray Place 1-2 sprays into both nostrils 2 (two) times daily. 10/02/18  Yes [provider]  MECLIZINE HCL PO Take 1 tablet by mouth daily as needed (vertigo).   Yes [provider]  famotidine (PEPCID) 20 MG tablet Take 1 tablet (20 mg total) by mouth 2 (two) times daily. 12/13/18   Nikan Ellingson A, PA-C  Norethindrone Acetate-Ethinyl Estrad-FE (LOESTRIN 24 FE) 1-20 MG-MCG(24) tablet Take 1 tablet by mouth daily. Take ONLY active pills. Patient not taking: Reported on 12/13/2018 10/21/18   Willodean RosenthalHarraway-Smith, Carolyn, MD    Family History No family history on file.  Social History Social History   Tobacco Use  . Smoking status: Current Every Day Smoker    Packs/day: 0.25    Types: Cigarettes  . Smokeless tobacco: Never Used  Substance Use Topics  . Alcohol use: Yes    Comment: occ  . Drug use: No     Allergies   Pineapple   Review of Systems Review of Systems  Constitutional: Negative.  Negative for  diaphoresis, fatigue and fever.  HENT: Negative.   Respiratory: Negative.   Cardiovascular: Positive for chest pain. Negative for palpitations and leg swelling.  Gastrointestinal: Positive for abdominal pain. Negative for abdominal distention, anal bleeding, blood in stool, constipation, diarrhea, nausea, rectal pain and vomiting.  Genitourinary: Negative.   Musculoskeletal: Negative.   Skin: Negative.   Neurological: Negative.   All other systems reviewed and are negative.    Physical Exam Updated Vital Signs BP 113/64   Pulse 63   Temp 98.1 F (36.7 C) (Oral)   Resp 12   Ht 5\' 8"  (1.727 m)   Wt 61.2 kg   LMP 10/13/2018   SpO2 100%   BMI 20.53 kg/m    Physical Exam Vitals signs and nursing note reviewed.  Constitutional:      General: She is not in acute distress.    Appearance: She is well-developed. She is not ill-appearing, toxic-appearing or diaphoretic.  HENT:     Head: Normocephalic and atraumatic.  Eyes:     Pupils: Pupils are equal, round, and reactive to light.  Neck:     Musculoskeletal: Normal range of motion.  Cardiovascular:     Rate and Rhythm: Normal rate.     Pulses: Normal pulses.     Heart sounds: Normal heart sounds. No murmur. No friction rub. No gallop.   Pulmonary:     Effort: Pulmonary effort is normal. No respiratory distress.     Breath sounds: Normal breath sounds. No stridor. No wheezing, rhonchi or rales.     Comments: Lungs clear to auscultation bilateral without rhonchi, wheezes or rales.  No accessory muscle usage.  Able speak in full sentences without difficulty. Chest:     Chest wall: Tenderness present.     Comments: Chest wall tenderness palpation. Abdominal:     General: Bowel sounds are normal. There is no distension.     Palpations: Abdomen is soft.     Tenderness: There is abdominal tenderness in the epigastric area. There is no right CVA tenderness, left CVA tenderness, guarding or rebound. Negative signs include Murphy's sign.     Hernia: No hernia is present.     Comments: Abdomen tender to epigastric region.  No rebound or guarding.  No CVA tenderness.  No Murphy sign  Musculoskeletal: Normal range of motion.     Comments: Moves all extremities without difficulty  Skin:    General: Skin is warm and dry.     Comments: No rashes or lesions. Lower extremities without edema, erythema, warmth or tenderness.  Neurological:     Mental Status: She is alert.      ED Treatments / Results  Labs (all labs ordered are listed, but only abnormal results are displayed) Labs Reviewed  COMPREHENSIVE METABOLIC PANEL - Abnormal; Notable for the following components:      Result Value    Glucose, Bld 122 (*)    All other components within normal limits  URINALYSIS, ROUTINE W REFLEX MICROSCOPIC - Abnormal; Notable for the following components:   Hgb urine dipstick LARGE (*)    Ketones, ur 5 (*)    All other components within normal limits  LIPASE, BLOOD  CBC  TROPONIN I  POC URINE PREG, ED  I-STAT TROPONIN, ED  I-STAT TROPONIN, ED    EKG EKG Interpretation  Date/Time:  Friday December 13 2018 21:25:30 EST Ventricular Rate:  70 PR Interval:    QRS Duration: 97 QT Interval:  393 QTC Calculation: 424 R Axis:   81  Text Interpretation:  Sinus rhythm Borderline short PR interval Nonspecific T abnrm, anterolateral leads similar to prior 10/19 Confirmed by Meridee ScoreButler, Michael [phone]([id number]) on 12/13/2018 9:27:37 PM   Radiology Dg Chest 2 View  Result Date: 12/13/2018 CLINICAL DATA:  Pt reports LUQ pain since Christmas day. [facility] and was instructed to come to the ED for evaluation. She denies any n/v/d. She also reports L upper chest tightness which she thinks from her anxiety which has happened in the past. History of anxiety. EXAM: CHEST - 2 VIEW COMPARISON:  None. FINDINGS: The heart size and mediastinal contours are within normal limits. Both lungs are clear. The visualized skeletal structures are unremarkable. IMPRESSION: No active cardiopulmonary disease. Electronically Signed   By: Norva PavlovElizabeth  Brown M.D.   On: 12/13/2018 21:41   [place] Abdomen Limited Ruq  Result Date: 12/13/2018 CLINICAL DATA:  Abdominal pain for 2 days. EXAM: ULTRASOUND ABDOMEN LIMITED RIGHT UPPER QUADRANT COMPARISON:  None. FINDINGS: Gallbladder: Gallbladder has a normal appearance. Gallbladder wall is 1.6 millimeter, within normal limits. No stones or pericholecystic fluid. No sonographic Murphy's sign. Common bile duct: Diameter: 2.8 millimeters Liver: No focal lesion identified. Within normal limits in parenchymal echogenicity. Portal vein is patent on color Doppler imaging with normal  direction of blood flow towards the liver. IMPRESSION: Normal evaluation of the RIGHT UPPER QUADRANT. Electronically Signed   By: Norva PavlovElizabeth  Brown M.D.   On: 12/13/2018 21:54    Procedures Procedures (including critical care time)  Medications Ordered in ED Medications  alum & mag hydroxide-simeth (MAALOX/MYLANTA) 200-200-20 MG/5ML suspension 30 mL (30 mLs Oral Given 12/13/18 2301)    And  lidocaine (XYLOCAINE) 2 % viscous mouth solution 15 mL (15 mLs Oral Given 12/13/18 2300)     Initial Impression / Assessment and Plan / ED Course  I have reviewed the triage vital signs and the nursing notes.  Pertinent labs & imaging results that were available during my care of the patient were reviewed by me and considered in my medical decision making (see chart for details).  17 year old female who present otherwise well presents for evaluation of abdominal pain.  Patient was seen in urgent care earlier today who sent to the emergency department for evaluation for possible cholelithiasis.  Pain onset x3 days.  Pain located to the epigastric region.  Patient states she is also had some intermittent chest pain located to the left side of her chest. Afebrile, nonseptic, non-ill-appearing.  No aggravating or alleviating factors.  CBC without leukocytosis, metabolic panel with normal renal and liver function, mild elevation in glucose at 122, urine pregnancy negative, urinalysis negative for infection, lipase 33, troponin 0.00, do not feel patient need delta troponin as her pain has been presents for 3 days. If this was ACS troponin would have some degree of elevation. Heart score 0- low risk, PERC negative, Wells low risk, EKG negative without ischemic changes, US abdomen negative, x-ray negative for infiltrates, pneumothorax, CHF, pulmonary edema.  Low suspicion for ACS, PE, dissection causing patients symptoms at this time. Patient able to tolerate PO intake in department without difficulty. Patient with  relief of symptoms with GI cocktail.  Patient is nontoxic, nonseptic appearing, in no apparent distress.  Patient's pain and other symptoms adequately managed in emergency department. Labs, imaging and vitals reviewed.  Patient does not meet the SIRS or Sepsis criteria.  On repeat exam patient does not have a surgical abdomin and there are no peritoneal signs.  No indication of appendicitis, bowel  obstruction, bowel perforation, cholecystitis, diverticulitis, PID or ectopic pregnancy.   Chest pain is not likely of cardiac or pulmonary etiology d/t presentation, PERC negative, VSS, no tracheal deviation, no JVD or new murmur, RRR, breath sounds equal bilaterally, EKG without acute abnormalities, negative troponin, and negative CXR. Pt has been advised to return to the ED if CP becomes exertional, associated with diaphoresis or nausea, radiates to left jaw/arm, worsens or becomes concerning in any way. Patient discharged home with symptomatic treatment and given strict instructions for follow-up with their primary care physician.  I have also discussed reasons to return immediately to the ER.  Patient expresses understanding and agrees with plan. Patient states she already has an appointment with her PCP on Monday for a physical. Urged to keep appointment for re-evaluation.    Final Clinical Impressions(s) / ED Diagnoses   Final diagnoses:  Abdominal pain  Chest wall pain    ED Discharge Orders         Ordered    famotidine (PEPCID) 20 MG tablet  2 times daily     12/13/18 2309           Estrella Alcaraz A, PA-C 12/13/18 2322    Hersey Maclellan A, PA-C 12/13/18 2331    Terrilee Files, MD 12/14/18 [phone]

## 2018-12-13 NOTE — ED Triage Notes (Signed)
Pt reports LUQ pain since Christmas day.  [facility] and was instructed to come to the ED for evaluation.  She denies any n/v/d.  She also reports L upper chest tightness which she thinks from her anxiety which has happened in the past.  She has hx of anxiety.  She does not take meds for it.  The pain in her LUQ pain is intermittent but it is non-radiating.

## 2018-12-13 NOTE — Discharge Instructions (Addendum)
You were evaluated today for abdominal pain and chest pain. You labs, imaging and EKG were negative. You may have an ulcer. Please follow up with your PCP for reevaluation.   Return to the ED with any new or worsening symptoms.

## 2018-12-19 ENCOUNTER — Encounter: Payer: Self-pay | Admitting: Obstetrics & Gynecology

## 2018-12-19 ENCOUNTER — Ambulatory Visit (INDEPENDENT_AMBULATORY_CARE_PROVIDER_SITE_OTHER): Payer: Medicaid Other | Admitting: Obstetrics & Gynecology

## 2018-12-19 VITALS — BP 112/56 | HR 96 | Wt 139.0 lb

## 2018-12-19 DIAGNOSIS — Z3046 Encounter for surveillance of implantable subdermal contraceptive: Secondary | ICD-10-CM | POA: Diagnosis not present

## 2018-12-19 NOTE — Progress Notes (Signed)
Patient is still bleeding on Nexplanon. Armandina Stammer RN

## 2018-12-19 NOTE — Progress Notes (Signed)
   Subjective:    Patient ID: Wanda Houston, female    DOB: Jun 14, 2001, 18 y.o.   MRN: [id number]  HPI 18 yo  Single G0 here today to have her Nexplanon removed. She was started on lo estrin about 2 months ago. She is sexually active with her girlfriend of almost a year. She does not need contraception.    Review of Systems     Objective:   Physical Exam Breathing, conversing, and ambulating normally Well nourished, well hydrated White female, no apparent distress UPT was negative. Consent was signed. Time out procedure was done. Her left arm was prepped with betadine and infiltrated with 3 cc of 1% lidocaine. After adequate anesthesia was assured, the Nexplanon device was placed according to standard of care. Her arm was hemostatic and was bandaged. She tolerated the procedure well.     Assessment & Plan:  Contraception- not necessary at this point I have rec'd condoms prn sex with a female She will ask her mom if she has had Gardasil

## 2019-11-03 ENCOUNTER — Other Ambulatory Visit: Payer: Self-pay

## 2019-11-03 DIAGNOSIS — Z20822 Contact with and (suspected) exposure to covid-19: Secondary | ICD-10-CM

## 2019-11-05 LAB — NOVEL CORONAVIRUS, NAA: SARS-CoV-2, NAA: NOT DETECTED

## 2019-11-18 ENCOUNTER — Ambulatory Visit: Payer: Medicaid Other | Admitting: Women's Health

## 2020-05-18 ENCOUNTER — Other Ambulatory Visit: Payer: Self-pay

## 2020-05-18 ENCOUNTER — Ambulatory Visit (INDEPENDENT_AMBULATORY_CARE_PROVIDER_SITE_OTHER): Payer: No Payment, Other | Admitting: Licensed Clinical Social Worker

## 2020-05-18 DIAGNOSIS — F411 Generalized anxiety disorder: Secondary | ICD-10-CM | POA: Diagnosis not present

## 2020-05-19 DIAGNOSIS — F410 Panic disorder [episodic paroxysmal anxiety] without agoraphobia: Secondary | ICD-10-CM | POA: Insufficient documentation
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
## 2020-05-24 ENCOUNTER — Other Ambulatory Visit: Payer: Self-pay

## 2020-05-24 ENCOUNTER — Ambulatory Visit (INDEPENDENT_AMBULATORY_CARE_PROVIDER_SITE_OTHER): Payer: No Payment, Other | Admitting: Licensed Clinical Social Worker

## 2020-05-24 DIAGNOSIS — F411 Generalized anxiety disorder: Secondary | ICD-10-CM
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
## 2020-05-26 ENCOUNTER — Ambulatory Visit (HOSPITAL_COMMUNITY): Payer: Self-pay | Admitting: Psychiatry

## 2020-06-02 ENCOUNTER — Ambulatory Visit (INDEPENDENT_AMBULATORY_CARE_PROVIDER_SITE_OTHER): Payer: No Payment, Other | Admitting: Licensed Clinical Social Worker

## 2020-06-02 ENCOUNTER — Other Ambulatory Visit: Payer: Self-pay

## 2020-06-02 DIAGNOSIS — F411 Generalized anxiety disorder: Secondary | ICD-10-CM

## 2020-06-02 NOTE — Progress Notes (Signed)
   THERAPIST PROGRESS NOTE  Session Time: 50 min  Participation Level: Active  Behavioral Response: CasualAlertAnxious  Type of Therapy: Individual Therapy  Treatment Goals addressed: Anxiety and Coping  Interventions: Supportive, Reframing and Other: Psycho education  Summary: Wanda Houston is a 19 y.o. female who presents with ongoing struggles with anxiety. Pt much less anxious this session than last as crisis with her mother more managed. Pt reports her mother did continue to decline and became more actively suicidal. Mother in touch with her own therapist as suggested. All agreed pt's mother needed to go to the hospital which mother did willingly. Pt took mother to hosp. Mother stayed 3 days. Pt took over care of her 3 younger siblings who were living with mother. Pt still has younger siblings in her care. Her supervisor at work has allowed her to bring the children in to work with her so she can continue to work and give mother more space to improve. She reports her partner is supportive of the younger siblings living with them for now and "it was actually her suggestion". Mother is also staying over with them at times. Pt admits this has been a lot to take on. She states she coping adequately most days and has a tendency to put her emotions aside in a crisis in order to do what must be done practically at the time. Pt did not see this as a strength but worried something must be wrong with her for being able to "turn my emotions off". She said she felt like this made her seem "crazy" and it was "scary". Reframed this for her consideration and provided education on the power of self talk. Pt struggles with self esteem and acknowledges she may be hard on herself. Pt and her partner are going to be going to an annual family gathering on pt's father's side of the family in [place] next wk she is looking forward to. Pt states she and partner will be welcomed. Additional assessment of sexual identity  reveals pt is bisexual. Pt's mother will stay at pt's home with the younger siblings next wk. Pt able to say she does need a break and agrees this will be good for her. LCSW reviewed poc with pt verbal acceptance. Pt states appreciation for care.   Suicidal/Homicidal: Nowithout intent/plan  Therapist Response: Pt remains attentive in session and open to feedback/education. She is motivated to improve feelings of anxiety and understand herself better.  Plan: Return again in 2 weeks.  Diagnosis: Axis I: Generalized Anxiety Disorder    Axis II: Deferred  McFarlan Sink, LCSW 06/02/2020

## 2020-06-17 ENCOUNTER — Ambulatory Visit (INDEPENDENT_AMBULATORY_CARE_PROVIDER_SITE_OTHER): Payer: No Payment, Other | Admitting: Licensed Clinical Social Worker

## 2020-06-17 ENCOUNTER — Other Ambulatory Visit: Payer: Self-pay

## 2020-06-17 DIAGNOSIS — F411 Generalized anxiety disorder: Secondary | ICD-10-CM

## 2020-06-18 NOTE — Progress Notes (Signed)
   THERAPIST PROGRESS NOTE  Session Time: 55 min  Participation Level: Active  Behavioral Response: CasualAlertAnxious  Type of Therapy: Individual Therapy  Treatment Goals addressed: Anxiety, Communication: Additional Assessment and Coping  Interventions: Motivational Interviewing, Supportive and Reframing  Summary: Wanda Houston is a 19 y.o. female who presents with hx of GAD. Pt reports she is going fairly well. LCSW first assessed for her wk with partner in [place] last wk. Pt provides details. Overall a good trip and she appreciated the break/relaxation. Pt states her younger twin brothers, age 26, continue to live with her and partner. Pt's mother is also living with them at this time. Pt continues to take her brothers to work with her. Assessed for mothers status and role. Pt able to agree she is mothering her mother and brother's. Pt shares details she was told by her mother of a forensic interview she and 7 yr olds just had. It is unclear why pt was not included since boys are living with her. Remainder of session devoted to her choices and boundaries with mother. Spent time processing the other set of infant twin boys she raised when she was ~ age of 6. Pt becomes tearful and states much regret about feeling she did not do enough and let them down, has guilt. Facilitated detailed exploration of these thoughts and feelings with reality therapy of the inappropriate position she was placed in and her actual capabilities at that developmental age. Much reframing. Pt states insight she may be taking over care of 21 yr old twins to make up for what she believes she did wrong with older twins when she was a child. She does want to make sure the boys do not end up in [facility] system and worries about poor choices of mother when they are in her care. Again addressed self talk and today had literature to provide, which pt given at close of session to take with her. Reviewed poc. Pt states appreciation  for care.     Suicidal/Homicidal: Nowithout intent/plan  Therapist Response: Pt continues to remain very engaged in counseling process.  Plan: Return again in 1 week.  Diagnosis: Axis I: Generalized Anxiety Disorder    Axis II: Deferred    Coco Sink, LCSW 06/18/2020

## 2020-06-24 ENCOUNTER — Ambulatory Visit (INDEPENDENT_AMBULATORY_CARE_PROVIDER_SITE_OTHER): Payer: No Payment, Other | Admitting: Licensed Clinical Social Worker

## 2020-06-24 ENCOUNTER — Other Ambulatory Visit: Payer: Self-pay

## 2020-06-24 DIAGNOSIS — F411 Generalized anxiety disorder: Secondary | ICD-10-CM | POA: Diagnosis not present

## 2020-06-25 NOTE — Progress Notes (Signed)
   THERAPIST PROGRESS NOTE  Session Time:   Participation Level: Active  Behavioral Response: CasualAlertAnxious  Type of Therapy: Individual Therapy  Treatment Goals addressed: Anxiety and Coping  Interventions: Motivational Interviewing, Supportive and Reframing  Summary: Wanda Houston is a 19 y.o. female who presents with hx of GAD. Pt presents to session this date saying "I'm exhausted". She reports it was her partners birthday and they went on a trip to [place] Mon-Wed of this wk to celebrate. Micah Flesher out with local friends last night and states she had too much to drink. Had designated driver per pt. Pt reports new stressor of financial concerns given recent trip to [facility] for a wk and then [place] trip. "We overspent". Also new stressor r/t partner's job and needing to consider making a change. Remainder of session devoted to her current relationship with mother and caring for her 29 yr old twin siblings. Pt able to admit she is feeling "walked all over". Wants to be helpful and cares deeply yet irritability and stress of having mother and twins at her home is escalating. Pt able to admit she does not want them to continue to live with her. Assisted to process thoughts, feelings, review options on addressing situation. Pt intends to have a conversation with mother about the need for her to seek independent living and provide care to twins. Some role play r/t conversation as pt does not want to be "mean". Pt appreciated ideas on how to broach discussion. Pt will plan to write out what she wants to communicate and practice ahead of time. Assessed for thoughts on literature r/t to self talk. Pt reports she liked it and it was helpful. She is trying to be more mindful of self talk and it's role.    Suicidal/Homicidal: Nowithout intent/plan  Therapist Response: Pt remains open and receptive  Plan: Return again in 1 week.  Diagnosis: Axis I: Generalized Anxiety Disorder    Axis II:  Deferred  [place] Sink, LCSW 06/25/2020

## 2020-06-30 ENCOUNTER — Ambulatory Visit (INDEPENDENT_AMBULATORY_CARE_PROVIDER_SITE_OTHER): Payer: No Payment, Other | Admitting: Licensed Clinical Social Worker

## 2020-06-30 ENCOUNTER — Other Ambulatory Visit: Payer: Self-pay

## 2020-06-30 DIAGNOSIS — F411 Generalized anxiety disorder: Secondary | ICD-10-CM | POA: Diagnosis not present

## 2020-07-01 NOTE — Progress Notes (Signed)
   THERAPIST PROGRESS NOTE  Session Time: 55 min  Participation Level: Active  Behavioral Response: CasualAlertAnxious  Type of Therapy: Individual Therapy  Treatment Goals addressed: Communication: Stressors and Coping  Interventions: Motivational Interviewing, Solution Focused and Supportive  Summary: Kimia Finan is a 19 y.o. female who presents with hx of GAD. Pt continues to struggle to cope with current living situation. Pt reports feeling "overwhelmed" this past wk with "a high level of emotion." Mother and her 31 yr old twin siblings are still living in her small apt with her partner. Mother's behaviors described as very irresponsible. Session primarily devoted to processing events and discussions of the past wk with partner and then partner and pt with mother. Pt reports discussion with partner about circumstances ended up in an argument. Pt states partner was being very practical and pt more emotional with decision making so their was conflict. They ultimately worked it out and had a reasonable discussion with mother to set goals and boundaries. Pt feels discussion with mother went fairly well. Pt is no longer taking twins to work with her, pt's mother supposed to be caring for the boys yet pt reports ongoing gaps. LCSW provided active listening with motivational interviewing and problem solving as indicated. LCSW reviewed poc with pt's verbal agreement prior to close of session. Pt states appreciation for care.   Suicidal/Homicidal: Nowithout intent/plan  Therapist Response: Pt remains receptive to care.  Plan: Return again in 1 weeks.  Diagnosis: Axis I: Generalized Anxiety Disorder    Axis II: Deferred  Castine Sink, LCSW 07/01/2020

## 2020-07-07 ENCOUNTER — Other Ambulatory Visit: Payer: Self-pay

## 2020-07-07 ENCOUNTER — Ambulatory Visit (INDEPENDENT_AMBULATORY_CARE_PROVIDER_SITE_OTHER): Payer: No Payment, Other | Admitting: Licensed Clinical Social Worker

## 2020-07-07 DIAGNOSIS — F411 Generalized anxiety disorder: Secondary | ICD-10-CM

## 2020-07-07 NOTE — Progress Notes (Signed)
   THERAPIST PROGRESS NOTE  Session Time: 50 min  Participation Level: Active  Behavioral Response: CasualAlertAnxious  Type of Therapy: Individual Therapy  Treatment Goals addressed: Anxiety and Coping  Interventions: Solution Focused and Supportive  Summary: Wanda Houston is a 19 y.o. female who presents with hx of GAD. Pt reports she is "doing pretty good". She spoke with her mother about providing weekly updates on goals of finding work and her own place to live. Mother reportedly receptive. Pt states she believes mother may be offered a job at [employer] working in [employer]. Pt's financial strain continues with another breakdown of her car. She states her boss is planning on paying for her car repair and she used boss's car to come to appt today. She states boss is very supportive in multiple ways. LCSW assessed for mood and coping. Pt reports she had a "bad panic attack" a couple of days ago. In depth discussion of this panic attack and others including symptoms and patterns. Pt states she may have around 10 panic attacks a month but has not had one as bad as the last in more than 6 mon. She is unable to identify any particular trigger and cannot recall her thoughts at the time. Pt broke the TV remote in half and states she does have a pattern of breaking inanimate objects with bad panic attacks. Remainder of session devoted to coping and self talk. LCSW provided addition literature on self talk, pt will journal self talk between now and next session. She identifies breathing exercises as her primary coping strategy. ("4 count breathing") LCSW recommended punching bag or similar object to use for physical outlet. LCSW provided education on self guided imagery. Pt states "the beach" as her most calm and peaceful place that comes to mind. She verbalizes understanding of strategy and will attempt to use this. LCSW reviewed poc with pt's verbal agreement prior to close of session. Pt states  appreciation for care.    Suicidal/Homicidal: Nowithout intent/plan  Therapist Response: Pt receptive and responsive to care.  Plan: Return again in 1 week.  Diagnosis: Axis I: Generalized Anxiety Disorder    Axis II: Deferred  [place] Sink, LCSW 07/07/2020

## 2020-07-14 ENCOUNTER — Ambulatory Visit (INDEPENDENT_AMBULATORY_CARE_PROVIDER_SITE_OTHER): Payer: No Payment, Other | Admitting: Licensed Clinical Social Worker

## 2020-07-14 ENCOUNTER — Other Ambulatory Visit: Payer: Self-pay

## 2020-07-14 ENCOUNTER — Encounter (HOSPITAL_COMMUNITY): Payer: Self-pay | Admitting: Psychiatry

## 2020-07-14 ENCOUNTER — Ambulatory Visit (INDEPENDENT_AMBULATORY_CARE_PROVIDER_SITE_OTHER): Payer: No Payment, Other | Admitting: Psychiatry

## 2020-07-14 DIAGNOSIS — F410 Panic disorder [episodic paroxysmal anxiety] without agoraphobia: Secondary | ICD-10-CM

## 2020-07-14 DIAGNOSIS — F411 Generalized anxiety disorder: Secondary | ICD-10-CM

## 2020-07-14 MED ORDER — CITALOPRAM HYDROBROMIDE 10 MG PO TABS: 10.0000 mg | ORAL_TABLET | Freq: Every day | ORAL | 1 refills | Status: DC

## 2020-07-14 NOTE — Progress Notes (Signed)
Psychiatric Initial Adult Assessment   Patient Identification: Wanda Houston MRN:  [id number] Date of Evaluation:  07/14/2020   Referral Source: Ms. Venora Maples (therapist)  Chief Complaint:   " have been dealing with anxiety and panic attacks for a long time."  Visit Diagnosis:    ICD-10-CM   1. Generalized anxiety disorder with panic attacks  F41.1    F41.0     History of Present Illness: 19 year old female with history of anxiety and panic attacks now seen for psychiatric evaluation. Patient informed that she needs to be seen at [facility] however she did not feel comfortable providing care and therefore did not follow through. She informed that she has been regularly seeing Ms. Adela Lank for individual therapy since early June. Her therapist feels that she should consider medication to address her symptoms better. And that is the reason why she agreed to see the Clinical research associate.  Patient reported that she has been dealing with anxiety ever since she was a teenager. She does not recall exactly when the symptoms started however she noticed when she was 19 years old that she having a lot of difficulties. She informed that she dropped out of high school and did not graduate. 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 She informed that her mother and her younger siblings are temporarily living with them which is causing a lot of chaos at home.  She reported that she always feels anxious and nervous. She always thinks of negative outcomes and has catastrophic outcomes in her mind. She always feels on the edge and keyed up. She cannot relax and is always worried about something on the other. She has racing thoughts about trivial things that have been during the day. She also reported having episodes of panic attacks when her heart races and she sweats excessively with a choking sensation in her throat. She informed that lately she has been having around 3 episodes of panic attacks per week  which is a lot better compared to a few months ago. She stated that she is very overwhelmed and stressed she may cry and scream but usually she has a tendency to run away from the situation.  She informed that she was prescribed Zoloft in the past however that did not help her much and caused her side effects. She reported that she had nosebleeds and vivid dreams when she took it. She also reported that she took Vistaril along with Zoloft as needed as she felt high when she took it and therefore she does not intend to go back on it.  She stated that she is extremely worried about being on medications. She stated that she is worried that she may take the medication at the wrong time and the wrong way and that will cause further side effects and issues.  She denied any overt symptom suggestive of depression, mania or hypomania. She denied any psychotic symptoms.  She denied any illicit substance abuse or excessive consumption of alcohol. She has tried marijuana recreationally in the past however that made her anxiety and panic attacks worse and she has no intention to use it again.   Past Psychiatric History: Anxiety, generalized anxiety disorder.  Previous Psychotropic Medications: Yes - Sertraline- s/e nose bleeds, vivid dreams  Substance Abuse History in the last 12 months:  No.  Consequences of Substance Abuse: NA  Past Medical History:  Past Medical History:  Diagnosis Date  . Anxiety   . Depression   . Vertigo  No past surgical history on file.  Family Psychiatric History: There is family history of substance abuse in her mother in the past.  Family History: No family history on file.  Social History:   Social History   Socioeconomic History  . Marital status: Media planner    Spouse name: Not on file  . Number of children: Not on file  . Years of education: Not on file  . Highest education level: Not on file  Occupational History  . Not on file  Tobacco Use  .  Smoking status: Current Every Day Smoker    Packs/day: 0.25    Types: Cigarettes  . Smokeless tobacco: Never Used  Vaping Use  . Vaping Use: Every day  Substance and Sexual Activity  . Alcohol use: Yes    Comment: occ  . Drug use: No  . Sexual activity: Yes    Birth control/protection: Implant  Other Topics Concern  . Not on file  Social History Narrative  . Not on file   Social Determinants of Health   Financial Resource Strain:   . Difficulty of Paying Living Expenses:   Food Insecurity:   . Worried About Programme researcher, broadcasting/film/video in the Last Year:   . Barista in the Last Year:   Transportation Needs:   . Freight forwarder (Medical):   Marland Kitchen Lack of Transportation (Non-Medical):   Physical Activity:   . Days of Exercise per Week:   . Minutes of Exercise per Session:   Stress:   . Feeling of Stress :   Social Connections:   . Frequency of Communication with Friends and Family:   . Frequency of Social Gatherings with Friends and Family:   . Attends Religious Services:   . Active Member of Clubs or Organizations:   . Attends Banker Meetings:   Marland Kitchen Marital Status:     Additional Social History: Works at a preschool, lives with her partner  Allergies:   Allergies  Allergen Reactions  . Pineapple     Tongue swells    Metabolic Disorder Labs: No results found for: HGBA1C, MPG No results found for: PROLACTIN No results found for: CHOL, TRIG, HDL, CHOLHDL, VLDL, LDLCALC No results found for: TSH  Therapeutic Level Labs: No results found for: LITHIUM No results found for: CBMZ No results found for: VALPROATE  Current Medications: Current Outpatient Medications  Medication Sig Dispense Refill  . cetirizine (ZYRTEC) 10 MG tablet Take 10 mg by mouth daily.    . famotidine (PEPCID) 20 MG tablet Take 1 tablet (20 mg total) by mouth 2 (two) times daily. (Patient not taking: Reported on 12/19/2018) 30 tablet 0  . fluticasone (FLONASE) 50 MCG/ACT nasal  spray Place 1-2 sprays into both nostrils 2 (two) times daily.  3  . MECLIZINE HCL PO Take 1 tablet by mouth daily as needed (vertigo).     No current facility-administered medications for this visit.    Musculoskeletal: Strength & Muscle Tone: within normal limits Gait & Station: normal Patient leans: N/A  Psychiatric Specialty Exam: Review of Systems  There were no vitals taken for this visit.There is no height or weight on file to calculate BMI.  General Appearance: Well Groomed  Eye Contact:  Good  Speech:  Clear and Coherent and Normal Rate  Volume:  Normal  Mood:  Anxious  Affect:  Congruent  Thought Process:  Goal Directed and Descriptions of Associations: Intact  Orientation:  Full (Time, Place, and Person)  Thought Content:  Logical  Suicidal Thoughts:  No  Homicidal Thoughts:  No  Memory:  Immediate;   Good Recent;   Good  Judgement:  Fair  Insight:  Fair  Psychomotor Activity:  Normal  Concentration:  Concentration: Good and Attention Span: Good  Recall:  Good  Fund of Knowledge:Good  Language: Good  Akathisia:  Negative  Handed:  Right  AIMS (if indicated):  not done  Assets:  Communication Skills Desire for Improvement Financial Resources/Insurance Housing Social Support Transportation  ADL's:  Intact  Cognition: WNL  Sleep:  Good     Assessment and Plan: Based on patient's history and evaluation she meets criteria for generalized anxiety disorder with panic attacks. She has tried Zoloft in the past however had significant side effects and does not want to try it again. She was agreeable to trial of Celexa to target her symptoms.  1. Generalized anxiety disorder with panic attacks  -Start citalopram (CELEXA) 10 MG tablet; Take 1 tablet (10 mg total) by mouth daily with breakfast.  Dispense: 30 tablet; Refill: 1  Continue individual therapy with Ms. Adela Lank Follow-up in 6 to 8 weeks.  Zena Amos, MD 7/28/20212:28 PM

## 2020-07-15 NOTE — Progress Notes (Signed)
   THERAPIST PROGRESS NOTE  Session Time: 55 min  Participation Level: Active  Behavioral Response: CasualAlertAnxious  Type of Therapy: Individual Therapy  Treatment Goals addressed: Anxiety and Coping  Interventions: CBT and Supportive  Summary: Wanda Houston is a 19 y.o. female who presents with hx of GAD. Pt states she has been doing fairly well since last session. She reports additional complications with her car. Further reports her good friend from [employer], [place], and fam just gave her a car two days ago. Pt states she has been included in this family since high school as Wanda Houston did not have a place to be for Thanksgiving/Christmas some years. Wanda Houston learned Wanda Houston was having car problems and facilitated her getting this car that no one in the fam was apparently using. Pt very excited and touched they would do this. Wanda Houston validated feelings and shared in her joy. Pt goes on to say she is very nervous about med management appt she has today after session with Wanda Houston. Wanda Houston addressed specific concerns and assisted to process. Assessed for mother obtaining work. Mother did get job at [employer] ex but has already missed work so [place] uncertain if she will retain job. Everyone continues to live with [place] and her partner. Remainder of session devoted to pt concerns she "could have OCD or ADHD" because her boss suggested this could be the case. Processed these thoughts with pt and asked assessment questions as indicated. Pt does not meet criteria for OCD, she has had attention problems since childhood with variable focus as an adult currently. Pt has made past reference to feeling like she has tendencies toward hypochondria. Addressed worry and coping. Pt has gotten a punching bag, she has listened to soothing sounds. Reports she forgot about self guided imagery when asked. Reviewed and practiced imagery. Pt did read additional self talk literature she states it was helpful. Spent time providing  added education on self talk, awareness of self talk. Addressed self-esteem. Wanda Houston reviewed poc prior to close of session with pt's verbal agreement. Pt states appreciation for care.        Suicidal/Homicidal: Nowithout intent/plan  Therapist Response: Remains receptive and responsive to care.  Plan: Return again in 1 week.  Diagnosis: Axis I: Generalized Anxiety Disorder    Axis II: Deferred  [place] Sink, Wanda Houston 07/15/2020

## 2020-07-21 ENCOUNTER — Other Ambulatory Visit: Payer: Self-pay

## 2020-07-21 ENCOUNTER — Ambulatory Visit (INDEPENDENT_AMBULATORY_CARE_PROVIDER_SITE_OTHER): Payer: No Payment, Other | Admitting: Licensed Clinical Social Worker

## 2020-07-21 DIAGNOSIS — F411 Generalized anxiety disorder: Secondary | ICD-10-CM | POA: Diagnosis not present

## 2020-07-22 NOTE — Progress Notes (Signed)
   THERAPIST PROGRESS NOTE  Session Time: 50 min  Participation Level: Active  Behavioral Response: CasualAlertAnxious  Type of Therapy: Individual Therapy  Treatment Goals addressed: Anxiety and Coping  Interventions: Motivational Interviewing and Supportive  Summary: Wanda Houston is a 19 y.o. female who presents with hx of GAD. Pt comes for in person session. LCSW assessed for outcome of pt's experience with med management appt. She reports it went well and she has started Celexa 10mg  1xday. Pt reports she has been on med approx one wk and has not missed any doses. She states she cannot feel a significant difference as yet but is pleased to have no negative side effects to date. Pt aware of time frames to appreciate full benefits of med. Much of session devoted to processing thoughts/feelings/options for dealing with mother and twin siblings remaining in her home as well as mother's ongoing inappropriate parenting. Mother unemployed again. Pt states she is feeling much "resentment". reports her partner has told mother today that she has until the end of the month to find another place to live. Dia Sitter also reports her partner is probably going to loose her job as she refuses to wear a mask. They are already struggling financially and Dia Sitter is very worried, feeling like the weight of the whole situation/hosehold is on her shoulders. Pt denies any panic attacks over the past wk despite extra stress. She states she is sleeping well. Addressed coping strategies. Pt did practice self guided imagery when feeling anxious and reports she felt better, it is helpful. She incorporated deep breathing with imagery. LCSW praised pt's practice. Encouraged her to continue.      Suicidal/Homicidal: Nowithout intent/plan  Therapist Response: Pt remains open and receptive to care.  Plan: Return again in 1 week.  Diagnosis: Axis I: Generalized Anxiety Disorder    Axis II: Deferred  Dia Sitter, LCSW 07/22/2020

## 2020-07-28 ENCOUNTER — Ambulatory Visit (INDEPENDENT_AMBULATORY_CARE_PROVIDER_SITE_OTHER): Payer: No Payment, Other | Admitting: Licensed Clinical Social Worker

## 2020-07-28 ENCOUNTER — Other Ambulatory Visit: Payer: Self-pay

## 2020-07-28 DIAGNOSIS — F411 Generalized anxiety disorder: Secondary | ICD-10-CM | POA: Diagnosis not present

## 2020-07-29 NOTE — Progress Notes (Signed)
   THERAPIST PROGRESS NOTE  Session Time: 55 min  Participation Level: Active  Behavioral Response: CasualAlertAnxious  Type of Therapy: Individual Therapy  Treatment Goals addressed: Anxiety and Coping  Interventions: CBT, Motivational Interviewing and Supportive  Summary: Wanda Houston is a 19 y.o. female who presents with hx of GAD. Pt comes for in person session. Pt continues to display restlessness and symptoms of anx. Pt provides updates on situation with mother and 21 yr old twin siblings. She states her mother has gotten another job. Brayton Caves informed pt's mother she has until 8/23 to find another place for her and boys to live. Pt reports mother is planning to move in with a woman she knows as a roommate only. Pt advises she is trying to feel less responsible for her mother, reports feeling "apathetic" about the situation as her mother has not changed much regardless of how much she has tried to help her. She is admitting to feelings of guilt over having mother/brothers leave her home. Assisted to process these thoughts and feelings. Pt reports partner has a job interview she is pleased about. Addressed coping and self talk. Pt reports being very pleased with herself for how often she is catching herself giving negative messaging to self. Discussed pt often catastrophizing circumstances and pt reports insight about having "unrealistic expectations" often. Commended pt for working on self talk and realizing how much it can impact emotions/behaviors. Reviewed coping strategies. Assessed for med management. Pt reports she is continuing with meds as prescribed, no negative side effects and feels they are helping. Reviewed poc with pt's verbal acceptance prior to close of session. Pt states appreciation for care.     Suicidal/Homicidal: Nowithout intent/plan  Therapist Response: Pt remains receptive to care. See notes.  Plan: Return again in 1 week.  Diagnosis: Axis I: Generalized Anxiety  Disorder    Axis II: Deferred  [place] Sink, LCSW 07/29/2020

## 2020-08-04 ENCOUNTER — Ambulatory Visit (HOSPITAL_COMMUNITY): Payer: Self-pay | Admitting: Licensed Clinical Social Worker

## 2020-08-05 ENCOUNTER — Other Ambulatory Visit (HOSPITAL_COMMUNITY): Payer: Self-pay | Admitting: Psychiatry

## 2020-08-05 DIAGNOSIS — F410 Panic disorder [episodic paroxysmal anxiety] without agoraphobia: Secondary | ICD-10-CM

## 2020-08-11 ENCOUNTER — Other Ambulatory Visit: Payer: Self-pay

## 2020-08-11 ENCOUNTER — Ambulatory Visit (INDEPENDENT_AMBULATORY_CARE_PROVIDER_SITE_OTHER): Payer: No Payment, Other | Admitting: Licensed Clinical Social Worker

## 2020-08-11 DIAGNOSIS — F411 Generalized anxiety disorder: Secondary | ICD-10-CM
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
## 2020-08-18 ENCOUNTER — Ambulatory Visit (INDEPENDENT_AMBULATORY_CARE_PROVIDER_SITE_OTHER): Payer: No Payment, Other | Admitting: Licensed Clinical Social Worker

## 2020-08-18 ENCOUNTER — Other Ambulatory Visit: Payer: Self-pay

## 2020-08-18 DIAGNOSIS — F411 Generalized anxiety disorder: Secondary | ICD-10-CM | POA: Diagnosis not present

## 2020-08-19 NOTE — Progress Notes (Signed)
THERAPIST PROGRESS NOTE  Session Time: 55 min Virtual Visit via Video Note  I connected with Wanda Houston on 08/18/20 at  1:00 PM EDT by a video enabled telemedicine application and verified that I am speaking with the correct person using two identifiers.  Location: Patient: Work Provider: [facility]   I discussed the limitations of evaluation and management by telemedicine and the availability of in person appointments. The patient expressed understanding and agreed to proceed. I discussed the assessment and treatment plan with the patient. The patient was provided an opportunity to ask questions and all were answered. The patient agreed with the plan and demonstrated an understanding of the instructions. I provided 55 min minutes of non-face-to-face time during this encounter.  Participation Level: Active  Behavioral Response: CasualAlertSad  Type of Therapy: Individual Therapy  Treatment Goals addressed: Coping  Interventions: Supportive and Other: Grief education and counseling  Summary: Wanda Houston is a 19 y.o. female who presents with hx of GAD. This date pt signs on for video session per her preference. Pt reports she is at work and the children are sleeping. She states she is there alone this wk as boss on vacation so she could not come for in person session. When LCSW assessed for coping with the family situation she described last session pt states "not bad". Pt presents as less anxious, talking slower, appears sad. Pt admits to some sadness r/t to the twins. She states she has not been calling twins a lot because it makes her sad. Last spoke with them ~ 1 wk ago. She states the calls are going both ways as Adonnis will call her and put the boys on the line. She states she does still feel positive about the care the boys with get with Adonnis. Pt wishes they were not so far away in [place], [place]. She has been minimally tearful per her report when asked. States she feels "weird"  she has not cried more. She states she and her partner have also not talked in any depth about this significant change, which also seems "weird". Pt advises they have both been focused on work and trying to get their finances in better order. Reports partner loves her new job and pt too states it is a great fit for them. She acknowledges she and partner did some inappropriate spending r/t travel recently but they also were impacted by the twins and her mother staying with them. LCSW assessed for any communication with mother. Pt states she has had none and still does not plan to communicate with her. Mother has not tried to reach out to pt. Pt states "I wish she could magically get better and I could have a relationship with my mom but I know that is not realistic". Pt states "I know she is not good for me". LCSW assisted Wanda Houston to process these multiple losses/changes. Provided education on the grief process which is a part of any loss not only loss to death. Pt verbalizes understanding and agreement. She states she did just lose two friends to death, a female friend named Wanda Houston and a friend she used to work with at [employer]. She provides details. She feels hurt she was "not important enough" to be notified at the time of death. She found out about one death on Facebook and the other by going to [employer] to eat. LCSW provided additional grief education/counseling. Pt states she is taking her meds as prescribed. She reports she does not feel overwhelming  results for the better with meds yet remains pleased she has no negative side effects to date. LCSW reveiwed coping strategies and poc including scheduling limitations prior to close of session. Pt states understanding and appreciation for care.     Suicidal/Homicidal: Nowithout intent/plan  Therapist Response: Pt remains receptive to care.  Plan: Return again in 1 week.  Diagnosis: Axis I: Generalized Anxiety Disorder    Axis II:  Deferred  [place] Sink, LCSW 08/19/2020

## 2020-08-25 ENCOUNTER — Ambulatory Visit (HOSPITAL_COMMUNITY): Payer: No Payment, Other | Admitting: Licensed Clinical Social Worker

## 2020-08-25 ENCOUNTER — Telehealth (HOSPITAL_COMMUNITY): Payer: Self-pay | Admitting: Licensed Clinical Social Worker

## 2020-08-25 NOTE — Telephone Encounter (Signed)
LCSW phone pt after she was a no show for session today. Pt very repsonsible and this is out of character for her not to communicate a need to make a change. Phone call went to vm. Left a message for pt regarding session and hoping she was okay. Provided information on next session date and time.

## 2020-09-01 ENCOUNTER — Ambulatory Visit (HOSPITAL_COMMUNITY): Payer: Self-pay | Admitting: Licensed Clinical Social Worker

## 2020-09-08 ENCOUNTER — Ambulatory Visit (INDEPENDENT_AMBULATORY_CARE_PROVIDER_SITE_OTHER): Payer: No Payment, Other | Admitting: Licensed Clinical Social Worker

## 2020-09-08 ENCOUNTER — Other Ambulatory Visit: Payer: Self-pay

## 2020-09-08 DIAGNOSIS — F411 Generalized anxiety disorder: Secondary | ICD-10-CM
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
## 2020-09-15 ENCOUNTER — Encounter (HOSPITAL_COMMUNITY): Payer: Self-pay | Admitting: Psychiatry

## 2020-09-15 ENCOUNTER — Other Ambulatory Visit: Payer: Self-pay

## 2020-09-15 ENCOUNTER — Ambulatory Visit (HOSPITAL_COMMUNITY): Payer: Self-pay | Admitting: Licensed Clinical Social Worker

## 2020-09-15 ENCOUNTER — Telehealth (INDEPENDENT_AMBULATORY_CARE_PROVIDER_SITE_OTHER): Payer: No Payment, Other | Admitting: Psychiatry

## 2020-09-15 DIAGNOSIS — F411 Generalized anxiety disorder: Secondary | ICD-10-CM

## 2020-09-15 DIAGNOSIS — F410 Panic disorder [episodic paroxysmal anxiety] without agoraphobia: Secondary | ICD-10-CM | POA: Diagnosis not present

## 2020-09-15 MED ORDER — CITALOPRAM HYDROBROMIDE 10 MG PO TABS: 10.0000 mg | ORAL_TABLET | Freq: Every day | ORAL | 1 refills | Status: DC

## 2020-09-15 NOTE — Progress Notes (Signed)
BH OP Progress Note   Virtual Visit via Video Note  I connected with Wanda Houston on 09/15/20 at  1:40 PM EDT by a video enabled telemedicine application and verified that I am speaking with the correct person using two identifiers.  Location: Patient: Home Provider: Clinic   I discussed the limitations of evaluation and management by telemedicine and the availability of in person appointments. The patient expressed understanding and agreed to proceed.  I provided 15 minutes of non-face-to-face time during this encounter.     Patient Identification: Wanda Houston MRN:  [id number] Date of Evaluation:  09/15/2020    Chief Complaint:   "I am doing better."  Visit Diagnosis:    ICD-10-CM   1. Generalized anxiety disorder with panic attacks  F41.1 citalopram (CELEXA) 10 MG tablet   F41.0     History of Present Illness: Patient reported she is doing well.  She stated that she has counselors to be helpful and she has not noticed any side effects.  She stated that she still feels like herself and her anxiety is much better now.  She stated that she has been seeing her therapist regularly and they both noticed that the frequency of her panic attacks is reduced.  However she still has some of the same intensity when she has them.  Overall she feels she is on the right track.  She would like to continue the same regimen for now.  Past Psychiatric History: Anxiety, generalized anxiety disorder.  Previous Psychotropic Medications: Yes - Sertraline- s/e nose bleeds, vivid dreams  Substance Abuse History in the last 12 months:  No.  Consequences of Substance Abuse: NA  Past Medical History:  Past Medical History:  Diagnosis Date  . Anxiety   . Depression   . Vertigo    History reviewed. No pertinent surgical history.  Family Psychiatric History: There is family history of substance abuse in her mother in the past.  Family History: History reviewed. No pertinent family  history.  Social History:   Social History   Socioeconomic History  . Marital status: Media planner    Spouse name: Not on file  . Number of children: Not on file  . Years of education: Not on file  . Highest education level: Not on file  Occupational History  . Not on file  Tobacco Use  . Smoking status: Current Every Day Smoker    Packs/day: 0.25    Types: Cigarettes  . Smokeless tobacco: Never Used  Vaping Use  . Vaping Use: Every day  Substance and Sexual Activity  . Alcohol use: Yes    Comment: occ  . Drug use: No  . Sexual activity: Yes    Birth control/protection: Implant  Other Topics Concern  . Not on file  Social History Narrative  . Not on file   Social Determinants of Health   Financial Resource Strain:   . Difficulty of Paying Living Expenses: Not on file  Food Insecurity:   . Worried About Programme researcher, broadcasting/film/video in the Last Year: Not on file  . Ran Out of Food in the Last Year: Not on file  Transportation Needs:   . Lack of Transportation (Medical): Not on file  . Lack of Transportation (Non-Medical): Not on file  Physical Activity:   . Days of Exercise per Week: Not on file  . Minutes of Exercise per Session: Not on file  Stress:   . Feeling of Stress : Not on file  Social Connections:   .  Frequency of Communication with Friends and Family: Not on file  . Frequency of Social Gatherings with Friends and Family: Not on file  . Attends Religious Services: Not on file  . Active Member of Clubs or Organizations: Not on file  . Attends Banker Meetings: Not on file  . 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 Pineapple     Tongue swells    Metabolic Disorder Labs: No results found for: HGBA1C, MPG No results found for: PROLACTIN No results found for: CHOL, TRIG, HDL, CHOLHDL, VLDL, LDLCALC No results found for:  TSH  Therapeutic Level Labs: No results found for: LITHIUM No results found for: CBMZ No results found for: VALPROATE  Current Medications: Current Outpatient Medications  Medication Sig Dispense Refill  . cetirizine (ZYRTEC) 10 MG tablet Take 10 mg by mouth daily.    . citalopram (CELEXA) 10 MG tablet Take 1 tablet (10 mg total) by mouth daily with breakfast. 90 tablet 1  . famotidine (PEPCID) 20 MG tablet Take 1 tablet (20 mg total) by mouth 2 (two) times daily. (Patient not taking: Reported on 12/19/2018) 30 tablet 0  . fluticasone (FLONASE) 50 MCG/ACT nasal spray Place 1-2 sprays into both nostrils 2 (two) times daily.  3  . MECLIZINE HCL PO Take 1 tablet by mouth daily as needed (vertigo).     No current facility-administered medications for this visit.    Musculoskeletal: Strength & Muscle Tone: within normal limits Gait & Station: normal Patient leans: N/A  Psychiatric Specialty Exam: Review of Systems  There were no vitals taken for this visit.There is no height or weight on file to calculate BMI.  General Appearance: Well Groomed  Eye Contact:  Good  Speech:  Clear and Coherent and Normal Rate  Volume:  Normal  Mood:  Euthymic  Affect:  Congruent  Thought Process:  Goal Directed and Descriptions of Associations: Intact  Orientation:  Full (Time, Place, and Person)  Thought Content:  Logical  Suicidal Thoughts:  No  Homicidal Thoughts:  No  Memory:  Immediate;   Good Recent;   Good  Judgement:  Fair  Insight:  Fair  Psychomotor Activity:  Normal  Concentration:  Concentration: Good and Attention Span: Good  Recall:  Good  Fund of Knowledge:Good  Language: Good  Akathisia:  Negative  Handed:  Right  AIMS (if indicated):  not done  Assets:  Communication Skills Desire for Improvement Financial Resources/Insurance Housing Social Support Transportation  ADL's:  Intact  Cognition: WNL  Sleep:  Good     Assessment and Plan: Patient appears to be doing  well on current regimen.  1. Generalized anxiety disorder with panic attacks  -Continue citalopram (CELEXA) 10 MG tablet; Take 1 tablet (10 mg total) by mouth daily with breakfast.  Dispense: 30 tablet; Refill: 1  Continue individual therapy with Ms. Adela Lank Follow-up in 2 months.   Zena Amos, MD 9/29/20212:06 PM

## 2020-09-22 ENCOUNTER — Ambulatory Visit (HOSPITAL_COMMUNITY): Payer: No Payment, Other | Admitting: Licensed Clinical Social Worker

## 2020-09-29 ENCOUNTER — Ambulatory Visit (HOSPITAL_COMMUNITY): Payer: Self-pay | Admitting: Licensed Clinical Social Worker

## 2020-10-01 ENCOUNTER — Ambulatory Visit (INDEPENDENT_AMBULATORY_CARE_PROVIDER_SITE_OTHER): Payer: No Payment, Other | Admitting: Licensed Clinical Social Worker

## 2020-10-01 ENCOUNTER — Other Ambulatory Visit: Payer: Self-pay

## 2020-10-01 DIAGNOSIS — F411 Generalized anxiety disorder: Secondary | ICD-10-CM | POA: Diagnosis not present

## 2020-10-04 NOTE — Progress Notes (Signed)
   THERAPIST PROGRESS NOTE  Session Time: 55 min  Participation Level: Active  Behavioral Response: CasualAlertMildly Anxious  Type of Therapy: Individual Therapy  Treatment Goals addressed: Anxiety and Coping  Interventions: CBT, Motivational Interviewing and Supportive  Summary: Wanda Houston is a 19 y.o. female who presents with hx of GAD. This date pt comes for in person session. LCSW assessed for overall status since last session as there is more of a gap d/t scheduling challenges than planned. Pt states she is doing "pretty good" She reports she has still had no contact with mother or grandmother and feels positive with decision. She does say she is not sure how the upcoming holiday season will unfold. Pt reports she and partner have discussed the holiday season and made a commitment to volunteer somewhere. She states they are still deciding exactly where but are happy to have a plan. LCSW commended choice. Pt states her financial stress is lower, partner being paid routinely now and budget for debt reduction underway. LCSW assessed for contact with twin siblings in [place]. Pt provides updates and states she has been able to restructure her thoughts about their situation as suggested causing reduced anx and sadness. Pt reports multiple examples of restructuring her self talk with greater awareness. LCSW commended pt for her attention to the matter. Pt able to see benefits. She is continuing meds as prescribed, no adjustments last med appt. Feels her anx is better managed with counseling and meds. Reports reduction in panic. Pt does state some concern over "seasonal depression" she feels she has noticed in past that may be returning. LCSW validated and explored. Pt reports she is intentionally planing more fun activities to offset/cope. LCSW reviewed all coping strategies and poc prior to close of session. Pt states appreciation for care.    Suicidal/Homicidal: Nowithout intent/plan  Therapist  Response: Pt remains receptive to care.  Plan: Return again in ~ 3 weeks.  Diagnosis: Axis I: Generalized Anxiety Disorder    Axis II: Deferred  [place] Sink, LCSW 10/04/2020

## 2020-10-06 ENCOUNTER — Ambulatory Visit (HOSPITAL_COMMUNITY): Payer: Self-pay | Admitting: Licensed Clinical Social Worker

## 2020-10-13 ENCOUNTER — Ambulatory Visit (HOSPITAL_COMMUNITY): Payer: Self-pay | Admitting: Licensed Clinical Social Worker

## 2020-10-20 ENCOUNTER — Ambulatory Visit (HOSPITAL_COMMUNITY): Payer: Self-pay | Admitting: Licensed Clinical Social Worker

## 2020-10-27 ENCOUNTER — Ambulatory Visit (HOSPITAL_COMMUNITY): Payer: Self-pay | Admitting: Licensed Clinical Social Worker

## 2020-11-03 ENCOUNTER — Ambulatory Visit (HOSPITAL_COMMUNITY): Payer: Self-pay | Admitting: Licensed Clinical Social Worker

## 2020-11-10 ENCOUNTER — Ambulatory Visit (INDEPENDENT_AMBULATORY_CARE_PROVIDER_SITE_OTHER): Payer: No Payment, Other | Admitting: Licensed Clinical Social Worker

## 2020-11-10 ENCOUNTER — Ambulatory Visit (HOSPITAL_COMMUNITY): Payer: Self-pay | Admitting: Licensed Clinical Social Worker

## 2020-11-10 ENCOUNTER — Other Ambulatory Visit: Payer: Self-pay

## 2020-11-10 DIAGNOSIS — F411 Generalized anxiety disorder: Secondary | ICD-10-CM
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
## 2020-11-18 ENCOUNTER — Encounter (HOSPITAL_COMMUNITY): Payer: Self-pay | Admitting: Psychiatry

## 2020-11-18 ENCOUNTER — Telehealth (INDEPENDENT_AMBULATORY_CARE_PROVIDER_SITE_OTHER): Payer: No Payment, Other | Admitting: Psychiatry

## 2020-11-18 ENCOUNTER — Other Ambulatory Visit: Payer: Self-pay

## 2020-11-18 DIAGNOSIS — F411 Generalized anxiety disorder: Secondary | ICD-10-CM

## 2020-11-18 DIAGNOSIS — F410 Panic disorder [episodic paroxysmal anxiety] without agoraphobia: Secondary | ICD-10-CM

## 2020-11-18 MED ORDER — CITALOPRAM HYDROBROMIDE 10 MG PO TABS: 10.0000 mg | ORAL_TABLET | Freq: Every day | ORAL | 1 refills | Status: DC

## 2020-11-18 NOTE — Progress Notes (Signed)
BH OP Progress Note   Virtual Visit via Video Note  I connected with Wanda Houston on 11/18/20 at  3:00 PM EST by a video enabled telemedicine application and verified that I am speaking with the correct person using two identifiers.  Location: Patient: Home Provider: Clinic   I discussed the limitations of evaluation and management by telemedicine and the availability of in person appointments. The patient expressed understanding and agreed to proceed.  I provided 16 minutes of non-face-to-face time during this encounter.     Patient Identification: Wanda Houston MRN:  [id number] Date of Evaluation:  11/18/2020    Chief Complaint:   "I threw up earlier today because of anxiety."  Visit Diagnosis:    ICD-10-CM   1. Generalized anxiety disorder with panic attacks  F41.1    F41.0     History of Present Illness: Pt reported that overall she is doing better ever since she started taking Celexa however she still has days when she feels exceedingly anxious with out any specific triggers and she ends up throwing up. She stated that she has been able to feel like herself lately.  When writer recommended possibly increasing the dose of Celexa to 20 mg for optimal effect pt stated that she would like to wait and think about it first. She stated that the medications is not an easy decision for her.  She stated that she is going to prepare herself and most likely make the decision of going up on the dose at the time of the next visit. She has been seeing Ms. Adela Lank regularly for therapy.  Past Psychiatric History: Anxiety, generalized anxiety disorder.  Previous Psychotropic Medications: Yes - Sertraline- s/e nose bleeds, vivid dreams  Substance Abuse History in the last 12 months:  No.  Consequences of Substance Abuse: NA  Past Medical History:  Past Medical History:  Diagnosis Date  . Anxiety   . Depression   . Vertigo    No past surgical history on file.  Family  Psychiatric History: There is family history of substance abuse in her mother in the past.  Family History: No family history on file.  Social History:   Social History   Socioeconomic History  . Marital status: Media planner    Spouse name: Not on file  . Number of children: Not on file  . Years of education: Not on file  . Highest education level: Not on file  Occupational History  . Not on file  Tobacco Use  . Smoking status: Current Every Day Smoker    Packs/day: 0.25    Types: Cigarettes  . Smokeless tobacco: Never Used  Vaping Use  . Vaping Use: Every day  Substance and Sexual Activity  . Alcohol use: Yes    Comment: occ  . Drug use: No  . Sexual activity: Yes    Birth control/protection: Implant  Other Topics Concern  . Not on file  Social History Narrative  . Not on file   Social Determinants of Health   Financial Resource Strain:   . Difficulty of Paying Living Expenses: Not on file  Food Insecurity:   . Worried About Programme researcher, broadcasting/film/video in the Last Year: Not on file  . Ran Out of Food in the Last Year: Not on file  Transportation Needs:   . Lack of Transportation (Medical): Not on file  . Lack of Transportation (Non-Medical): Not on file  Physical Activity:   . Days of Exercise per Week: Not on file  .  Minutes of Exercise per Session: Not on file  Stress:   . Feeling of Stress : Not on file  Social Connections:   . Frequency of Communication with Friends and Family: Not on file  . Frequency of Social Gatherings with Friends and Family: Not on file  . Attends Religious Services: Not on file  . Active Member of Clubs or Organizations: Not on file  . Attends Banker Meetings: Not on file  . 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 Pineapple     Tongue swells    Metabolic Disorder Labs: No results found for: HGBA1C,  MPG No results found for: PROLACTIN No results found for: CHOL, TRIG, HDL, CHOLHDL, VLDL, LDLCALC No results found for: TSH  Therapeutic Level Labs: No results found for: LITHIUM No results found for: CBMZ No results found for: VALPROATE  Current Medications: Current Outpatient Medications  Medication Sig Dispense Refill  . cetirizine (ZYRTEC) 10 MG tablet Take 10 mg by mouth daily.    . citalopram (CELEXA) 10 MG tablet Take 1 tablet (10 mg total) by mouth daily with breakfast. 90 tablet 1  . famotidine (PEPCID) 20 MG tablet Take 1 tablet (20 mg total) by mouth 2 (two) times daily. (Patient not taking: Reported on 12/19/2018) 30 tablet 0  . fluticasone (FLONASE) 50 MCG/ACT nasal spray Place 1-2 sprays into both nostrils 2 (two) times daily.  3  . MECLIZINE HCL PO Take 1 tablet by mouth daily as needed (vertigo).     No current facility-administered medications for this visit.    Musculoskeletal: Strength & Muscle Tone: within normal limits Gait & Station: normal Patient leans: N/A  Psychiatric Specialty Exam: Review of Systems  There were no vitals taken for this visit.There is no height or weight on file to calculate BMI.  General Appearance: Well Groomed  Eye Contact:  Good  Speech:  Clear and Coherent and Normal Rate  Volume:  Normal  Mood:  Anxious  Affect:  Congruent  Thought Process:  Goal Directed and Descriptions of Associations: Intact  Orientation:  Full (Time, Place, and Person)  Thought Content:  Logical  Suicidal Thoughts:  No  Homicidal Thoughts:  No  Memory:  Immediate;   Good Recent;   Good  Judgement:  Fair  Insight:  Fair  Psychomotor Activity:  Normal  Concentration:  Concentration: Good and Attention Span: Good  Recall:  Good  Fund of Knowledge:Good  Language: Good  Akathisia:  Negative  Handed:  Right  AIMS (if indicated):  not done  Assets:  Communication Skills Desire for Improvement Financial Resources/Insurance Housing Social  Support Transportation  ADL's:  Intact  Cognition: WNL  Sleep:  Good     Assessment and Plan: Patient seems to have intermittent episodes of anxiety. She was offered increase in the dose of Celexa to 20 mg but she would like to think about it and prepare herself before she starts a higher dose of the medication.  1. Generalized anxiety disorder with panic attacks  -Continue citalopram (CELEXA) 10 MG tablet; Take 1 tablet (10 mg total) by mouth daily with breakfast.  Dispense: 30 tablet; Refill: 1  Continue individual therapy with Ms. Adela Lank. Follow-up in 2 months.   Zena Amos, MD 12/2/20213:15 PM

## 2020-11-24 ENCOUNTER — Other Ambulatory Visit: Payer: Self-pay

## 2020-11-24 ENCOUNTER — Ambulatory Visit (INDEPENDENT_AMBULATORY_CARE_PROVIDER_SITE_OTHER): Payer: No Payment, Other | Admitting: Licensed Clinical Social Worker

## 2020-11-24 DIAGNOSIS — F411 Generalized anxiety disorder: Secondary | ICD-10-CM | POA: Diagnosis not present

## 2020-11-26 NOTE — Progress Notes (Signed)
   THERAPIST PROGRESS NOTE   Virtual Visit via Video Note  I connected with Wanda Houston on 11/24/20 at  2:00 PM EST by a video enabled telemedicine application and verified that I am speaking with the correct person using two identifiers.  Location: Patient: In car outside of work Provider: [facility]   I discussed the limitations of evaluation and management by telemedicine and the availability of in person appointments. The patient expressed understanding and agreed to proceed. I discussed the assessment and treatment plan with the patient. The patient was provided an opportunity to ask questions and all were answered. The patient agreed with the plan and demonstrated an understanding of the instructions.   I provided 45 minutes of non-face-to-face time during this encounter.  Participation Level: Active  Behavioral Response: CasualAlertEuthymic and Mildly anxious  Type of Therapy: Individual Therapy  Treatment Goals addressed: Anxiety and Coping  Interventions: CBT, Motivational Interviewing and Supportive  Summary: Wanda Houston is a 19 y.o. female who presents with hx of GAD. This date pt signs on for virtual appt. Pt Presents as engaging and mildly anxious. Denies panic. Pt states she is "doing good". LCSW assessed for outcome of Thanksgiving holiday. Pt reports she and partner did go over to friends home and they had a nice time. Pt reports she and partner are still planning to vol at Christmas but have not defined what this will be specifically. Pt talks about how much she "loves Christmas". LCSW assessed for other plans r/t Christmas. Pt provides details and speaks about all of the gift giving she plans to do. LCSW assessed for pt's realistic budgeting given recent financial stress/concerns she has been coping with. Pt goes into the fact she allows partner to do most all of the money management as talking about money makes her anxious. Further exploration of this reveals a pattern  of avoiding reality of finances and an "overspending habit". Pt gives examples. Assisted pt to process thoughts/feelings and strategies to manage. Education provided on desensitization re talking about finances and the importance of being financially savvy independently. Education provided on stop/think/listen/plan for all purchasing. Education provided on starting a savings/retirement plan early. Pt will start by reading more about finances and slowly increase exposure. Pt taking meds as prescribed, no side effects, acknowledges benefits. LCSW facilitated discussion of pt's progress and hard work this year in counseling, commended pt for all of efforts. Pt states she is grateful for guidance and how well she is doing. LCSW reviewed poc prior to close of session. Pt states appreciation for care.    Suicidal/Homicidal: Nowithout intent/plan  Therapist Response: Pt remains responsive to care.  Plan: Return again for next available or walk in prn.  Diagnosis: Axis I: Generalized Anxiety Disorder    Axis II: Deferred  [place] Sink, LCSW 11/26/2020

## 2021-01-06 ENCOUNTER — Ambulatory Visit (INDEPENDENT_AMBULATORY_CARE_PROVIDER_SITE_OTHER): Payer: No Payment, Other | Admitting: Licensed Clinical Social Worker

## 2021-01-06 ENCOUNTER — Other Ambulatory Visit: Payer: Self-pay

## 2021-01-06 DIAGNOSIS — F411 Generalized anxiety disorder: Secondary | ICD-10-CM | POA: Diagnosis not present

## 2021-01-10 NOTE — Progress Notes (Signed)
   THERAPIST PROGRESS NOTE   Virtual Visit via Video Note  I connected with [name] on 01/10/21 at  2:00 PM EST by a video enabled telemedicine application and verified that I am speaking with the correct person using two identifiers.  Location: Patient: Car outside of work Provider: Home   I discussed the limitations of evaluation and management by telemedicine and the availability of in person appointments. The patient expressed understanding and agreed to proceed. I discussed the assessment and treatment plan with the patient. The patient was provided an opportunity to ask questions and all were answered. The patient agreed with the plan and demonstrated an understanding of the instructions.  I provided 55 minutes of non-face-to-face time during this encounter.  Participation Level: Active  Behavioral Response: CasualAlertAnxious  Type of Therapy: Individual Therapy  Treatment Goals addressed: Anxiety and Coping  Interventions: CBT, Motivational Interviewing and Supportive  Summary: Abby Tucholski is a 20 y.o. female who presents with hx of GAD. This date pt signs on for video session per her preference. LCSW acknowledges gap in care as last appt 11/24/20. Pt verbalizes understanding. LCSW assessed for status of anx. Pt states her anx has mostly been "5" on a 0-10 scale with 10 the worst but today it is "8". 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 Pt reports how this over stimulation and frustration is escalating her anx. Pt denies panic today or recently. She continues to take meds as prescribed. Pt advises she has some good news. Pt is registered for a [employer] training April 1-3 in [place]. Pt states while this is a goal and something she wants to do, she is anx about it. She reports she started reading some of the materials and became overwhelmed thinking she would be unable to follow  through. Pt acknowledges anything r/t school is sensitive for her since she dropped out of high school. LCSW normalized some anx r/t starting anything new. Reviewed importance of self talk and self messaging. Provided encouragement and commended pt for following up on her stated goal. LCSW assessed for coping with Christmas. Pt provides updates. Reports she did text her mother "Felton Clinton" but has not been in touch otherwise. States she noticed mother has her blocked on Facebook. Pt reports she feels diminished stress without constant dynamics from mother and remains pleased with decision to distance from mother. Pt states she and partner had a nice Christmas overall. Pt advises she is struggling with more seasonal depression. LCSW provided education on light therapy pt will investigate. Assessed for status of addressing avoidance of finances. Pt reports she is improving. She states partner is still primarily managing finances but they are talking more regularly. She did not follow through on reading Salley Slaughter. Pt denies other worries/concerns to address this session. LCSW reviewed poc including scheduling prior to close of session. Pt states appreciation for care.   Suicidal/Homicidal: Nowithout intent/plan  Therapist Response: Pt remains receptive to care.  Plan: Return again for next avail or walk in prn.  Diagnosis: Axis I: Generalized Anxiety Disorder    Axis II: Deferred  [place] Sink, LCSW 01/10/2021

## 2021-01-13 ENCOUNTER — Telehealth (INDEPENDENT_AMBULATORY_CARE_PROVIDER_SITE_OTHER): Payer: No Payment, Other | Admitting: Psychiatry

## 2021-01-13 ENCOUNTER — Other Ambulatory Visit: Payer: Self-pay

## 2021-01-13 ENCOUNTER — Encounter (HOSPITAL_COMMUNITY): Payer: Self-pay | Admitting: Psychiatry

## 2021-01-13 DIAGNOSIS — F411 Generalized anxiety disorder: Secondary | ICD-10-CM | POA: Diagnosis not present

## 2021-01-13 DIAGNOSIS — F410 Panic disorder [episodic paroxysmal anxiety] without agoraphobia: Secondary | ICD-10-CM | POA: Diagnosis not present

## 2021-01-13 MED ORDER — CITALOPRAM HYDROBROMIDE 20 MG PO TABS: 20.0000 mg | ORAL_TABLET | Freq: Every day | ORAL | 1 refills | Status: DC

## 2021-01-13 NOTE — Progress Notes (Signed)
BH OP Progress Note   Virtual Visit via Video Note  I connected with Ayza Ripoll on 01/13/21 at  2:00 PM EST by a video enabled telemedicine application and verified that I am speaking with the correct person using two identifiers.  Location: Patient: Home Provider: Clinic   I discussed the limitations of evaluation and management by telemedicine and the availability of in person appointments. The patient expressed understanding and agreed to proceed.  I provided 15 minutes of non-face-to-face time during this encounter.     Patient Identification: Wanda Houston MRN:  [id number] Date of Evaluation:  01/13/2021    Chief Complaint:   "I had a bad panic attack the other day."  Visit Diagnosis:    ICD-10-CM   1. Generalized anxiety disorder with panic attacks  F41.1 citalopram (CELEXA) 20 MG tablet   F41.0     History of Present Illness: Patient reported that she is doing well overall however she still has some anxiety.  She stated that she is not having frequent panic attacks but she had one episode of a severe panic attack the other day when she was engaged in a emotional conversation with her partner.  She stated that she all of a sudden started to shake and she felt like the world around her was moving and spinning.   She thought that she was having a seizure. She also partner if they thought that she was having a seizure and they told her that it seemed like she was shivering but as if she was cold.  Patient stated that at this point she is agreeable and ready to go up on the dose.  She asked if she will be able to tolerate the increase in the dose.  She is worried about having any adverse reactions with the higher dose. Writer provided her with reassurance regarding that. Writer explained that going up on the dose will help with her anxiety and panic attacks.   Past Psychiatric History: Anxiety, generalized anxiety disorder.  Previous Psychotropic Medications: Yes -  Sertraline- s/e nose bleeds, vivid dreams  Substance Abuse History in the last 12 months:  No.  Consequences of Substance Abuse: NA  Past Medical History:  Past Medical History:  Diagnosis Date  . Anxiety   . Depression   . Vertigo    No past surgical history on file.  Family Psychiatric History: There is family history of substance abuse in her mother in the past.  Family History: No family history on file.  Social History:   Social History   Socioeconomic History  . Marital status: Media planner    Spouse name: Not on file  . Number of children: Not on file  . Years of education: Not on file  . Highest education level: Not on file  Occupational History  . Not on file  Tobacco Use  . Smoking status: Current Every Day Smoker    Packs/day: 0.25    Types: Cigarettes  . Smokeless tobacco: Never Used  Vaping Use  . Vaping Use: Every day  Substance and Sexual Activity  . Alcohol use: Yes    Comment: occ  . Drug use: No  . Sexual activity: Yes    Birth control/protection: Implant  Other Topics Concern  . Not on file  Social History Narrative  . 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 Pineapple     Tongue swells    Metabolic Disorder Labs: No results found for: HGBA1C, MPG No results found for: PROLACTIN No results found for: CHOL, TRIG, HDL, CHOLHDL, VLDL, LDLCALC No results found for: TSH  Therapeutic Level Labs: No results found for: LITHIUM No results found for: CBMZ No results found for: VALPROATE  Current Medications: Current Outpatient Medications  Medication Sig Dispense Refill  . citalopram (CELEXA) 20 MG tablet Take 1  tablet (20 mg total) by mouth daily with breakfast. 30 tablet 1  . cetirizine (ZYRTEC) 10 MG tablet Take 10 mg by mouth daily.    . famotidine (PEPCID) 20 MG tablet Take 1 tablet (20 mg total) by mouth 2 (two) times daily. (Patient not taking: Reported on 12/19/2018) 30 tablet 0  . fluticasone (FLONASE) 50 MCG/ACT nasal spray Place 1-2 sprays into both nostrils 2 (two) times daily.  3  . MECLIZINE HCL PO Take 1 tablet by mouth daily as needed (vertigo).     No current facility-administered medications for this visit.    Musculoskeletal: Strength & Muscle Tone: within normal limits Gait & Station: normal Patient leans: N/A  Psychiatric Specialty Exam: Review of Systems  There were no vitals taken for this visit.There is no height or weight on file to calculate BMI.  General Appearance: Well Groomed  Eye Contact:  Good  Speech:  Clear and Coherent and Normal Rate  Volume:  Normal  Mood:  Anxious  Affect:  Congruent  Thought Process:  Goal Directed and Descriptions of Associations: Intact  Orientation:  Full (Time, Place, and Person)  Thought Content:  Logical  Suicidal Thoughts:  No  Homicidal Thoughts:  No  Memory:  Immediate;   Good Recent;   Good  Judgement:  Fair  Insight:  Fair  Psychomotor Activity:  Normal  Concentration:  Concentration: Good and Attention Span: Good  Recall:  Good  Fund of Knowledge:Good  Language: Good  Akathisia:  Negative  Handed:  Right  AIMS (if indicated):  not done  Assets:  Communication Skills Desire for Improvement Financial Resources/Insurance Housing Social Support Transportation  ADL's:  Intact  Cognition: WNL  Sleep:  Good     Assessment and Plan: Patient had an episode of a significant panic attack, she is agreeable and ready to increase the dose of Celexa to 20 mg for optimal control of her panic attacks.   1. Generalized anxiety disorder with panic attacks  -Increase citalopram (CELEXA) 20 MG tablet; Take 1 tablet (20 mg  total) by mouth daily with breakfast.  Dispense: 30 tablet; Refill: 1  Continue individual therapy with Ms. Adela Lank. Follow-up in 2 months.   Zena Amos, MD 1/27/20222:13 PM

## 2021-01-27 ENCOUNTER — Other Ambulatory Visit (HOSPITAL_COMMUNITY): Payer: Self-pay | Admitting: Psychiatry

## 2021-01-27 DIAGNOSIS — F410 Panic disorder [episodic paroxysmal anxiety] without agoraphobia: Secondary | ICD-10-CM

## 2021-01-27 DIAGNOSIS — F411 Generalized anxiety disorder: Secondary | ICD-10-CM

## 2021-02-22 ENCOUNTER — Ambulatory Visit (INDEPENDENT_AMBULATORY_CARE_PROVIDER_SITE_OTHER): Payer: No Payment, Other | Admitting: Licensed Clinical Social Worker

## 2021-02-22 ENCOUNTER — Other Ambulatory Visit: Payer: Self-pay

## 2021-02-22 DIAGNOSIS — F411 Generalized anxiety disorder: Secondary | ICD-10-CM

## 2021-02-25 NOTE — Progress Notes (Signed)
   THERAPIST PROGRESS NOTE   Virtual Visit via Video Note  I connected with Wanda Houston on 02/22/21 at  2:00 PM EST by a video enabled telemedicine application and verified that I am speaking with the correct person using two identifiers.  Location: Patient: At work in private location Provider: [facility]   I discussed the limitations of evaluation and management by telemedicine and the availability of in person appointments. The patient expressed understanding and agreed to proceed.  I discussed the assessment and treatment plan with the patient. The patient was provided an opportunity to ask questions and all were answered. The patient agreed with the plan and demonstrated an understanding of the instructions.  I provided 45 minutes of non-face-to-face time during this encounter.  Participation Level: Active  Behavioral Response: CasualAlertAnxious  Type of Therapy: Individual Therapy  Treatment Goals addressed: Anxiety and Coping  Interventions: Supportive  Summary: Wanda Houston is a 20 y.o. female who presents with hx of GAD. This date pt is at work, the children are in nap time, pt reports her boss is sick. Pt states they have all had a stomach virus. Pt was sick last wk. Pt much improved physically and appears less anxious with more focused conversation/thoughts today. Pt last seen 01/06/21. LCSW assessed for overall updates. Pt advises her [employer] training class is now fully paid for and she is "excited" about attending in April. She states she has continued to study the materials and feels less anxious about the training. Pt reports some new family dynamics with her partner's family, particularly partner's father, Lyda Perone. Lyda Perone apparently is overly judgmental about people's weight despite him being reportedly overweight. He commented negatively about Jessie's weight causing conflict. Pt also reports one of Jessie's sister's has a new boyfriend and how he is being accepted into the  family and how hurtful it is she will likely never be accepted into the fam. LCSW assisted to process thoughts/feelings. Pt will consider letter writing to address anger toward [place]. Pt reports her mother sent her a text recently saying "Miss you". Pt also states mother moved to [place]. Pt did not answer text and continues to stay estranged from mother. This date pt states "I feel badly about myself" r/t situation with mother. LCSW explored this in detail and reframed as appropriate reminding pt of how she got to this decision and impacts of societal pressures/titles. Pt will also consider letter writing to cope with circumstances/anger r/t mother. LCSW reviewed poc including scheduling prior to close of session. Pt states appreciation for care.    Suicidal/Homicidal: Nowithout intent/plan  Therapist Response: Pt remains receptive to care.  Plan: Return again in ~3 weeks.  Diagnosis: Axis I: Generalized Anxiety Disorder    Axis II: Deferred  [place] Sink, LCSW 02/25/2021

## 2021-03-08 ENCOUNTER — Telehealth (HOSPITAL_COMMUNITY): Payer: No Payment, Other | Admitting: Psychiatry

## 2021-03-10 ENCOUNTER — Other Ambulatory Visit: Payer: Self-pay

## 2021-03-10 ENCOUNTER — Encounter (HOSPITAL_COMMUNITY): Payer: Self-pay | Admitting: Psychiatry

## 2021-03-10 ENCOUNTER — Telehealth (INDEPENDENT_AMBULATORY_CARE_PROVIDER_SITE_OTHER): Payer: No Payment, Other | Admitting: Psychiatry

## 2021-03-10 DIAGNOSIS — F410 Panic disorder [episodic paroxysmal anxiety] without agoraphobia: Secondary | ICD-10-CM

## 2021-03-10 DIAGNOSIS — F411 Generalized anxiety disorder: Secondary | ICD-10-CM

## 2021-03-10 MED ORDER — CITALOPRAM HYDROBROMIDE 20 MG PO TABS: 20.0000 mg | ORAL_TABLET | Freq: Every day | ORAL | 1 refills | Status: DC

## 2021-03-10 NOTE — Progress Notes (Signed)
BH OP Progress Note   Virtual Visit via Video Note  I connected with Medea Deines on 03/10/21 at  2:20 PM EDT by a video enabled telemedicine application and verified that I am speaking with the correct person using two identifiers.  Location: Patient: Car Provider: Clinic   I discussed the limitations of evaluation and management by telemedicine and the availability of in person appointments. The patient expressed understanding and agreed to proceed.  I provided 14 minutes of non-face-to-face time during this encounter.     Patient Identification: Wanda Houston MRN:  [id number] Date of Evaluation:  03/10/2021    Chief Complaint:   "Other than one bad day I have been doing well."  Visit Diagnosis:    ICD-10-CM   1. Generalized anxiety disorder with panic attacks  F41.1    F41.0     History of Present Illness: Patient reported that she feels she is doing better overall.  She stated that her anxiety and panic attacks have improved after the dose of Celexa was increased.  She stated that she is functioning well overall.  She mentioned that she had a bad day earlier this week and she felt a bit depressed and was more tearful than usual.  However other than this isolated days she has been doing well overall. She feels she is on the right regimen for now and would like to continue the same medication.  She denied any other issues or concerns at this time.  She has been sleeping well at night.   Past Psychiatric History: Anxiety, generalized anxiety disorder.  Previous Psychotropic Medications: Yes - Sertraline- s/e nose bleeds, vivid dreams  Substance Abuse History in the last 12 months:  No.  Consequences of Substance Abuse: NA  Past Medical History:  Past Medical History:  Diagnosis Date  . Anxiety   . Depression   . Vertigo    No past surgical history on file.  Family Psychiatric History: There is family history of substance abuse in her mother in the  past.  Family History: No family history on file.  Social History:   Social History   Socioeconomic History  . Marital status: Media planner    Spouse name: Not on file  . Number of children: Not on file  . Years of education: Not on file  . Highest education level: Not on file  Occupational History  . Not on file  Tobacco Use  . Smoking status: Current Every Day Smoker    Packs/day: 0.25    Types: Cigarettes  . Smokeless tobacco: Never Used  Vaping Use  . Vaping Use: Every day  Substance and Sexual Activity  . Alcohol use: Yes    Comment: occ  . Drug use: No  . Sexual activity: Yes    Birth control/protection: Implant  Other Topics Concern  . Not on file  Social History Narrative  . 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 Pineapple     Tongue swells    Metabolic Disorder Labs: No results found for: HGBA1C, MPG No results found for: PROLACTIN No results found for: CHOL, TRIG, HDL, CHOLHDL, VLDL, LDLCALC No  results found for: TSH  Therapeutic Level Labs: No results found for: LITHIUM No results found for: CBMZ No results found for: VALPROATE  Current Medications: Current Outpatient Medications  Medication Sig Dispense Refill  . cetirizine (ZYRTEC) 10 MG tablet Take 10 mg by mouth daily.    . citalopram (CELEXA) 20 MG tablet TAKE 1 TABLET BY MOUTH DAILY WITH BREAKFAST 90 tablet 1  . famotidine (PEPCID) 20 MG tablet Take 1 tablet (20 mg total) by mouth 2 (two) times daily. (Patient not taking: Reported on 12/19/2018) 30 tablet 0  . fluticasone (FLONASE) 50 MCG/ACT nasal spray Place 1-2 sprays into both nostrils 2 (two) times daily.  3  .  MECLIZINE HCL PO Take 1 tablet by mouth daily as needed (vertigo).     No current facility-administered medications for this visit.    Musculoskeletal: Strength & Muscle Tone: within normal limits Gait & Station: normal Patient leans: N/A  Psychiatric Specialty Exam: Review of Systems  There were no vitals taken for this visit.There is no height or weight on file to calculate BMI.  General Appearance: Fairly Groomed  Eye Contact:  Good  Speech:  Clear and Coherent and Normal Rate  Volume:  Normal  Mood:  Less anxious compared to last visit  Affect:  Congruent  Thought Process:  Goal Directed and Descriptions of Associations: Intact  Orientation:  Full (Time, Place, and Person)  Thought Content:  Logical  Suicidal Thoughts:  No  Homicidal Thoughts:  No  Memory:  Immediate;   Good Recent;   Good  Judgement:  Fair  Insight:  Fair  Psychomotor Activity:  Normal  Concentration:  Concentration: Good and Attention Span: Good  Recall:  Good  Fund of Knowledge:Good  Language: Good  Akathisia:  Negative  Handed:  Right  AIMS (if indicated):  not done  Assets:  Communication Skills Desire for Improvement Financial Resources/Insurance Housing Social Support Transportation  ADL's:  Intact  Cognition: WNL  Sleep:  Good     Assessment and Plan: Patient appears to be doing slightly better compared to the past.  She has noticed that increasing the dose of Celexa has helped her anxiety and panic attacks.  1. Generalized anxiety disorder with panic attacks  -Continue citalopram (CELEXA) 20 MG tablet; Take 1 tablet (20 mg total) by mouth daily with breakfast.  Dispense: 30 tablet; Refill: 1  Continue individual therapy with Ms. Adela Lank. Follow-up in 10 weeks.   Zena Amos, MD 3/24/20222:19 PM

## 2021-03-15 ENCOUNTER — Telehealth (HOSPITAL_COMMUNITY): Payer: Self-pay | Admitting: Licensed Clinical Social Worker

## 2021-03-15 NOTE — Telephone Encounter (Signed)
LCSW phoned pt to f/u on message from [facility] nurse sent last wk. Pt requested call back from this provider. This clinician has been out of the office since Tues of last wk until today, which was explained to pt. Pt states she thinks she had a depressive episode and her boss recommended she call. Pt states she ended up being okay after an extensive crying episode. Pt has appt with this LCSW tomorrow and states she is fine to talk about situation and coping strategies more tomorrow. She states appreciation for call.

## 2021-03-16 ENCOUNTER — Ambulatory Visit (INDEPENDENT_AMBULATORY_CARE_PROVIDER_SITE_OTHER): Payer: No Payment, Other | Admitting: Licensed Clinical Social Worker

## 2021-03-16 ENCOUNTER — Other Ambulatory Visit: Payer: Self-pay

## 2021-03-16 DIAGNOSIS — F411 Generalized anxiety disorder: Secondary | ICD-10-CM

## 2021-03-16 DIAGNOSIS — F410 Panic disorder [episodic paroxysmal anxiety] without agoraphobia: Secondary | ICD-10-CM

## 2021-03-18 NOTE — Progress Notes (Signed)
   THERAPIST PROGRESS NOTE  Virtual Visit via Video Note  I connected with Wanda Houston on 03/16/21 at  2:00 PM EDT by a video enabled telemedicine application and verified that I am speaking with the correct person using two identifiers.  Location: Patient: In car outside of work Provider: [facility]   I discussed the limitations of evaluation and management by telemedicine and the availability of in person appointments. The patient expressed understanding and agreed to proceed. I discussed the assessment and treatment plan with the patient. The patient was provided an opportunity to ask questions and all were answered. The patient agreed with the plan and demonstrated an understanding of the instructions.   I provided 50 minutes of non-face-to-face time during this encounter.  Participation Level: Active  Behavioral Response: CasualAlertAnxious  Type of Therapy: Individual Therapy  Treatment Goals addressed: Communication: anx/dep/coping  Interventions: CBT and Supportive  Summary: Wanda Houston is a 20 y.o. female who presents with hx of GAD. This date pt signs on for video session per her preference. Pt presents in good spirits with smiles, appropriate laughter and good eye contact. LCSW addressed "deppressive episode" pt called to report between sessions. She provides details saying she was "sobbing uncontrollably" starting Sun night 2 Sundays ago and remained upset through Monday. Had to have her partner pick her up from work Mon as she could not stay, too tearful. Pt states she has "no idea" what she was upset about. She could not say what she was thinking about. In exploration of relationship matters pt recalls her mother had called her phone two days before her tearful episode. She states she did not answer the call and no message was left. In processing this matter pt feels this could have been what triggered her tearfulness as she felt bad about not answering and worried something  could be wrong. Pt admits she was catastrophizing and friends/boss helped her reason through. Reviewed self talk and coping strategies. Relationship with partner going well per pt. LCSW assessed for pt trying letter writing r/t anger with Kirk/mom. Pt states she has not yet done this d/t studying for [employer] training but intends to. Pt will be leaving for [employer] training tomorrow. Pt states she has some anx, which LCSW normalized. Taking meds as prescribed. Reviewed poc including scheduling prior to close of session. Pt states appreciation for care.  Suicidal/Homicidal: Nowithout intent/plan  Therapist Response: Pt remains receptive to care.  Plan: Return again in ~3 weeks.  Diagnosis: Axis I: Generalized Anxiety Disorder  Cash Sink, LCSW 03/18/2021

## 2021-04-05 ENCOUNTER — Telehealth (HOSPITAL_COMMUNITY): Payer: Self-pay | Admitting: Licensed Clinical Social Worker

## 2021-04-05 ENCOUNTER — Other Ambulatory Visit: Payer: Self-pay

## 2021-04-05 ENCOUNTER — Ambulatory Visit (HOSPITAL_COMMUNITY): Payer: No Payment, Other | Admitting: Licensed Clinical Social Worker

## 2021-04-05 NOTE — Telephone Encounter (Signed)
LCSW sent text link for video session per schedule. LCSW remained avail online until 2:12 but pt failed to sign on.

## 2021-05-30 ENCOUNTER — Encounter (HOSPITAL_COMMUNITY): Payer: Self-pay | Admitting: Psychiatry

## 2021-05-30 ENCOUNTER — Other Ambulatory Visit: Payer: Self-pay

## 2021-05-30 ENCOUNTER — Telehealth (INDEPENDENT_AMBULATORY_CARE_PROVIDER_SITE_OTHER): Payer: No Payment, Other | Admitting: Psychiatry

## 2021-05-30 DIAGNOSIS — F411 Generalized anxiety disorder: Secondary | ICD-10-CM

## 2021-05-30 DIAGNOSIS — F410 Panic disorder [episodic paroxysmal anxiety] without agoraphobia: Secondary | ICD-10-CM

## 2021-05-30 MED ORDER — CITALOPRAM HYDROBROMIDE 20 MG PO TABS
20.0000 mg | ORAL_TABLET | Freq: Every day | ORAL | 1 refills | Status: DC
Start: 2021-05-30 — End: 2021-08-26

## 2021-05-30 NOTE — Progress Notes (Signed)
BH OP Progress Note     Virtual Visit via Video Note  I connected with [name] on 05/30/21 at  2:00 PM EDT by a video enabled telemedicine application and verified that I am speaking with the correct person using two identifiers.  Location: Patient: Home Provider: Clinic   I discussed the limitations of evaluation and management by telemedicine and the availability of in person appointments. The patient expressed understanding and agreed to proceed.  I provided 14 minutes of non-face-to-face time during this encounter.     Patient Identification: Wanda [place] MRN:  [id number] Date of Evaluation:  05/30/2021    Chief Complaint:   "I am doing well."  Visit Diagnosis:    ICD-10-CM   1. Generalized anxiety disorder with panic attacks  F41.1    F41.0       History of Present Illness: Patient reported that she is doing well.  She informed that she is no longer working at her job and she is missing that.  She stated that after being there for some time she and her supervisor spoke to each other and discussed that this job is probably not a good fit for her. She stated that currently she is unemployed but is looking for something and is hoping to find something soon.  She is visiting [place] in couple of days for a brief vacation. She denies any specific stressors or triggers.  Her anxiety and panic attacks have been under control. She denies any other concerns today.  Past Psychiatric History: Anxiety, generalized anxiety disorder.  Previous Psychotropic Medications: Yes - Sertraline- s/e nose bleeds, vivid dreams  Substance Abuse History in the last 12 months:  No.  Consequences of Substance Abuse: NA  Past Medical History:  Past Medical History:  Diagnosis Date   Anxiety    Depression    Vertigo    No past surgical history on file.  Family Psychiatric History: There is family history of substance abuse in her mother in the past.  Family History: No family  history on file.  Social History:   Social History   Socioeconomic History   Marital status: Media planner    Spouse name: Not on file   Number of children: Not on file   Years of education: Not on file   Highest education level: Not on file  Occupational History   Not on file  Tobacco Use   Smoking status: Every Day    Packs/day: 0.25    Pack years: 0.00    Types: Cigarettes   Smokeless tobacco: Never  Vaping Use   Vaping Use: Every day  Substance and Sexual Activity   Alcohol use: Yes    Comment: occ   Drug use: No   Sexual activity: Yes    Birth control/protection: Implant  Other Topics Concern   Not on file  Social History Narrative   Not on file   Social Determinants of Health   Financial Resource Strain: Not on file  Food Insecurity: Not on file  Transportation Needs: Not on file  Physical Activity: Not on file  Stress: Not on file  Social Connections: Not on file     Allergies:   Allergies  Allergen Reactions   Pineapple     Tongue swells    Metabolic Disorder Labs: No results found for: HGBA1C, MPG No results found for: PROLACTIN No results found for: CHOL, TRIG, HDL, CHOLHDL, VLDL, LDLCALC No results found for: TSH  Therapeutic Level Labs: No results found for:  LITHIUM No results found for: CBMZ No results found for: VALPROATE  Current Medications: Current Outpatient Medications  Medication Sig Dispense Refill   cetirizine (ZYRTEC) 10 MG tablet Take 10 mg by mouth daily.     citalopram (CELEXA) 20 MG tablet Take 1 tablet (20 mg total) by mouth daily with breakfast. 90 tablet 1   famotidine (PEPCID) 20 MG tablet Take 1 tablet (20 mg total) by mouth 2 (two) times daily. (Patient not taking: Reported on 12/19/2018) 30 tablet 0   fluticasone (FLONASE) 50 MCG/ACT nasal spray Place 1-2 sprays into both nostrils 2 (two) times daily.  3   MECLIZINE HCL PO Take 1 tablet by mouth daily as needed (vertigo).     No current facility-administered  medications for this visit.    Musculoskeletal: Strength & Muscle Tone: within normal limits Gait & Station: normal Patient leans: N/A  Psychiatric Specialty Exam: Review of Systems  There were no vitals taken for this visit.There is no height or weight on file to calculate BMI.  General Appearance: Fairly Groomed  Eye Contact:  Good  Speech:  Clear and Coherent and Normal Rate  Volume:  Normal  Mood:  Euthymic  Affect:  Congruent  Thought Process:  Goal Directed and Descriptions of Associations: Intact  Orientation:  Full (Time, Place, and Person)  Thought Content:  Logical  Suicidal Thoughts:  No  Homicidal Thoughts:  No  Memory:  Immediate;   Good Recent;   Good  Judgement:  Fair  Insight:  Fair  Psychomotor Activity:  Normal  Concentration:  Concentration: Good and Attention Span: Good  Recall:  Good  Fund of Knowledge:Good  Language: Good  Akathisia:  Negative  Handed:  Right  AIMS (if indicated):  not done  Assets:  Communication Skills Desire for Improvement Financial Resources/Insurance Housing Social Support Transportation  ADL's:  Intact  Cognition: WNL  Sleep:  Good     Assessment and Plan: Pt appears to be doing well at present.  1. Generalized anxiety disorder with panic attacks  -Continue citalopram (CELEXA) 20 MG tablet; Take 1 tablet (20 mg total) by mouth daily with breakfast.  Dispense: 30 tablet; Refill: 1  Continue individual therapy with Ms. Adela Lank. Follow-up in 3 months. Patient was made aware due to the writer leaving the office her care is being transferred to another provider, she prefers a female provider, so was scheduled with Dr. Doyne Keel.   Zena Amos, MD 6/13/20222:03 PM

## 2021-06-02 ENCOUNTER — Telehealth (HOSPITAL_COMMUNITY): Payer: Self-pay | Admitting: Licensed Clinical Social Worker

## 2021-06-02 ENCOUNTER — Ambulatory Visit (HOSPITAL_COMMUNITY): Payer: No Payment, Other | Admitting: Licensed Clinical Social Worker

## 2021-06-02 NOTE — Telephone Encounter (Signed)
LCSW sent text message with link for video session per schedule. LCSW remained online avail for session until 2:12. Pt failed to sign on for session.

## 2021-08-18 ENCOUNTER — Ambulatory Visit (INDEPENDENT_AMBULATORY_CARE_PROVIDER_SITE_OTHER): Payer: No Payment, Other | Admitting: Licensed Clinical Social Worker

## 2021-08-18 DIAGNOSIS — F411 Generalized anxiety disorder: Secondary | ICD-10-CM | POA: Diagnosis not present

## 2021-08-23 NOTE — Progress Notes (Signed)
   THERAPIST PROGRESS NOTE   Virtual Visit via Video Note  I connected with Wanda Houston on 08/18/21 at  8:00 AM EDT by a video enabled telemedicine application and verified that I am speaking with the correct person using two identifiers.  Location: Patient: Home Provider: [facility]   I discussed the limitations of evaluation and management by telemedicine and the availability of in person appointments. The patient expressed understanding and agreed to proceed. I discussed the assessment and treatment plan with the patient. The patient was provided an opportunity to ask questions and all were answered. The patient agreed with the plan and demonstrated an understanding of the instructions.   I provided 50 minutes of non-face-to-face time during this encounter.  Participation Level: Active  Behavioral Response: CasualAlertAnxious  Type of Therapy: Individual Therapy  Treatment Goals addressed: Anxiety and Coping  Interventions: CBT and Supportive  Summary: Wanda Houston is a 20 y.o. female who presents with hx of GAD. Pt not seen since 3/30. She is in an uplifted mood and smiling often. Discussed need for new CCA and goals at next session. LCSW assessed for overall status since last session and significant changes. Pt reports she continues to live with her partner Brayton Caves in same apt. She reports they are getting along well and have been together 3.5 yrs now. 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 She provides details of conflict with Vernona Rieger and is no longer in contact with Vernona Rieger. Pt expresses feelings of loss and hurt saying Vernona Rieger was "very harsh". Assisted pt to process thoughts feelings of loss reminding of stages of grief/loss. Pt reports she is now working at a [employer], educational she likes very much and is making a higher income. Brayton Caves remains with the brewery and doing her musical performances. Pt states they are in a fairly good place with finances for a change.  Pt reports she is taking meds as prescribed and doing well. She reports minimal panic. Pt advises she went to a graduation party for her older twin brothers. She saw her mother and grandmother saying she communicated with both of them "sparingly" and superficially. Pt remains out of regular communication with both of them. She got to see younger twin siblings from [place] at same party and states they are thriving where they are. She also reports their mother is going to [place] fairly often to see the boys which was concerning. CPS no longer following per pt. 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 Pt staying steady with this goal but reports she got off track some after job loss and search for new job. Encouraged positive self talk and to do list, process steps to stay on track. Reviewed coping strategies. Pt states she got a new kitten bringing mostly enjoyment with some destruction. LCSW reviewed poc including scheduling prior to close of session. Pt states appreciation for care.   Suicidal/Homicidal: Nowithout intent/plan  Therapist Response: Pt receptive to care.  Plan: Return again for next avail appt.  Diagnosis: Axis I: Generalized Anxiety Disorder  Bleckley Sink, LCSW 08/23/2021

## 2021-08-26 ENCOUNTER — Encounter (HOSPITAL_COMMUNITY): Payer: Self-pay | Admitting: Psychiatry

## 2021-08-26 ENCOUNTER — Telehealth (INDEPENDENT_AMBULATORY_CARE_PROVIDER_SITE_OTHER): Payer: No Payment, Other | Admitting: Psychiatry

## 2021-08-26 ENCOUNTER — Other Ambulatory Visit: Payer: Self-pay

## 2021-08-26 DIAGNOSIS — F411 Generalized anxiety disorder: Secondary | ICD-10-CM | POA: Diagnosis not present

## 2021-08-26 DIAGNOSIS — F410 Panic disorder [episodic paroxysmal anxiety] without agoraphobia: Secondary | ICD-10-CM

## 2021-08-26 MED ORDER — CITALOPRAM HYDROBROMIDE 20 MG PO TABS
20.0000 mg | ORAL_TABLET | Freq: Every day | ORAL | 3 refills | Status: DC
Start: 2021-08-26 — End: 2021-11-25

## 2021-08-26 NOTE — Progress Notes (Addendum)
BH MD/PA/NP OP Progress Note Virtual Visit via Video Note  I connected with Wanda Houston on 08/26/21 at 11:00 AM EDT by a video enabled telemedicine application and verified that I am speaking with the correct person using two identifiers.  Location: Patient: Home Provider: Clinic   I discussed the limitations of evaluation and management by telemedicine and the availability of in person appointments. The patient expressed understanding and agreed to proceed.  I provided 30 minutes of non-face-to-face time during this encounter.   08/26/2021 12:15 PM Wanda Houston  MRN:  [id number]  Chief Complaint: "Everything is going well"  HPI: 20 year old female seen today for follow up psychiatric evaluation. She is a former patient of Dr. Quintella Baton who is being transferred to writer for medication management. She has a psychiatric history of anxiety and depression. She is currently managed on Celexa 20 mg daily. She notes her medication is effective in managing her psychiatric conditions.  Today she is well groomed, pleasant, cooperative, and engaged in conversation. She notes that everything is going well. She reports that Celexa has been the best antidressant she has tried as she had side effects from several others. She notes that her mood is stable and reports she has minimal anxiety and depression. Provider conducted a GAD 7 and patient scored a 12. Provider also conducted a PHQ 9 and patient scored a 9. She endorses adequate sleep and appetite. She notes that in the last year she has gained over 20 lbs. Today she denies SI/HI/VAH, mania, or paranoia.  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   No medication changes made today. Patient agreeable to continue medications as prescribed. She will follow up with outpatient counseling for therapy. No other concerns noted at this time.  Visit Diagnosis:    ICD-10-CM   1. Generalized  anxiety disorder with panic attacks  F41.1 citalopram (CELEXA) 20 MG tablet   F41.0       Past Psychiatric History: Anxiety and depression   Past Medical History:  Past Medical History:  Diagnosis Date   Anxiety    Depression    Vertigo    History reviewed. No pertinent surgical history.  Family Psychiatric History: There is family history of substance abuse in her mother in the past.    Family History: History reviewed. No pertinent family history.  Social History:  Social History   Socioeconomic History   Marital status: Media planner    Spouse name: Not on file   Number of children: Not on file   Years of education: Not on file   Highest education level: Not on file  Occupational History   Not on file  Tobacco Use   Smoking status: Every Day    Packs/day: 0.25    Types: Cigarettes   Smokeless tobacco: Never  Vaping Use   Vaping Use: Every day  Substance and Sexual Activity   Alcohol use: Yes    Comment: occ   Drug use: No   Sexual activity: Yes    Birth control/protection: Implant  Other Topics Concern   Not on file  Social History Narrative   Not on file   Social Determinants of Health   Financial Resource Strain: Not on file  Food Insecurity: Not on file  Transportation Needs: Not on file  Physical Activity: Not on file  Stress: Not on file  Social Connections: Not on file    Allergies:  Allergies  Allergen Reactions   Pineapple     Tongue swells    Metabolic Disorder Labs: No results found for: HGBA1C, MPG No results found for: PROLACTIN No results found for: CHOL, TRIG, HDL, CHOLHDL, VLDL, LDLCALC No results found for: TSH  Therapeutic Level Labs: No results found for: LITHIUM No results found for: VALPROATE No components found for:  CBMZ  Current Medications: Current Outpatient Medications  Medication Sig Dispense Refill   cetirizine (ZYRTEC) 10 MG tablet Take 10 mg by mouth daily.     citalopram (CELEXA) 20 MG tablet Take 1  tablet (20 mg total) by mouth daily with breakfast. 90 tablet 3   famotidine (PEPCID) 20 MG tablet Take 1 tablet (20 mg total) by mouth 2 (two) times daily. (Patient not taking: Reported on 12/19/2018) 30 tablet 0   fluticasone (FLONASE) 50 MCG/ACT nasal spray Place 1-2 sprays into both nostrils 2 (two) times daily.  3   MECLIZINE HCL PO Take 1 tablet by mouth daily as needed (vertigo).     No current facility-administered medications for this visit.     Musculoskeletal: Strength & Muscle Tone:  Unable to assess due to telehealth visit  Gait & Station:  Unable to assess due to telehealth visi Patient leans: N/A  Psychiatric Specialty Exam: Review of Systems  There were no vitals taken for this visit.There is no height or weight on file to calculate BMI.  General Appearance: Well Groomed  Eye Contact:  Good  Speech:  Clear and Coherent  Volume:  Normal  Mood:  Euthymic  Affect:  Appropriate and Congruent  Thought Process:  Coherent, Goal Directed, and Linear  Orientation:  Full (Time, Place, and Person)  Thought Content: WDL and Logical   Suicidal Thoughts:  No  Homicidal Thoughts:  No  Memory:  Immediate;   Good Recent;   Good Remote;   Good  Judgement:  Good  Insight:  Good  Psychomotor Activity:  Normal  Concentration:  Concentration: Good and Attention Span: Good  Recall:  Good  Fund of Knowledge: Good  Language: Good  Akathisia:  No  Handed:  Right  AIMS (if indicated): not done  Assets:  Communication Skills Desire for Improvement Financial Resources/Insurance Housing Intimacy Physical Health Resilience Social Support  ADL's:  Intact  Cognition: WNL  Sleep:  Good   Screenings: GAD-7    Flowsheet Row Video Visit from 08/26/2021 in [facility]  Total GAD-7 Score 12      PHQ2-9    Flowsheet Row Video Visit from 08/26/2021 in [facility] Counselor from 08/18/2021 in [facility]  PHQ-2 Total Score 1 2  PHQ-9 Total Score 9 10      Flowsheet Row Video Visit from 08/26/2021 in [facility] Counselor from 08/18/2021 in [facility]  C-SSRS RISK CATEGORY No Risk No Risk        Assessment and Plan: Patient notes that she is doing well on her current medication regimen. No medication changes made today. Patient agreeable to continue medications as prescribed.  1. Generalized anxiety disorder with panic attacks  Continue- citalopram (CELEXA) 20 MG tablet; Take 1 tablet (20 mg total) by mouth daily with breakfast.  Dispense: 90 tablet; Refill: 3.   Follow up in 3 months Follow up with therapy.  Shanna Cisco, NP 08/26/2021, 12:15 PM

## 2021-10-07 ENCOUNTER — Telehealth (HOSPITAL_COMMUNITY): Payer: Self-pay | Admitting: Licensed Clinical Social Worker

## 2021-10-07 ENCOUNTER — Other Ambulatory Visit: Payer: Self-pay

## 2021-10-07 ENCOUNTER — Ambulatory Visit (HOSPITAL_COMMUNITY): Payer: No Payment, Other | Admitting: Licensed Clinical Social Worker

## 2021-10-07 NOTE — Telephone Encounter (Signed)
LCSW sent text message with link for video session per schedule. LCSW remained online available for session until 8:12am. Pt failed to sign on for session.

## 2021-10-28 ENCOUNTER — Telehealth (HOSPITAL_COMMUNITY): Payer: Self-pay | Admitting: Licensed Clinical Social Worker

## 2021-10-28 ENCOUNTER — Ambulatory Visit (HOSPITAL_COMMUNITY): Payer: No Payment, Other | Admitting: Licensed Clinical Social Worker

## 2021-10-28 NOTE — Telephone Encounter (Signed)
LCSW sent text message with link for video session per schedule. LCSW remained online available for session until 9:13am. Pt failed to sign on for session.

## 2021-11-25 ENCOUNTER — Encounter (HOSPITAL_COMMUNITY): Payer: Self-pay | Admitting: Psychiatry

## 2021-11-25 ENCOUNTER — Telehealth (INDEPENDENT_AMBULATORY_CARE_PROVIDER_SITE_OTHER): Payer: No Payment, Other | Admitting: Psychiatry

## 2021-11-25 DIAGNOSIS — F410 Panic disorder [episodic paroxysmal anxiety] without agoraphobia: Secondary | ICD-10-CM | POA: Diagnosis not present

## 2021-11-25 DIAGNOSIS — F411 Generalized anxiety disorder: Secondary | ICD-10-CM

## 2021-11-25 MED ORDER — CITALOPRAM HYDROBROMIDE 20 MG PO TABS
20.0000 mg | ORAL_TABLET | Freq: Every day | ORAL | 3 refills | Status: DC
Start: 2021-11-25 — End: 2022-02-10

## 2021-11-25 NOTE — Progress Notes (Signed)
BH MD/PA/NP OP Progress Note Virtual Visit via Video Note  I connected with Wanda Houston on 11/25/21 at 10:30 AM EST by a video enabled telemedicine application and verified that I am speaking with the correct person using two identifiers.  Location: Patient: Home Provider: Clinic   I discussed the limitations of evaluation and management by telemedicine and the availability of in person appointments. The patient expressed understanding and agreed to proceed.  I provided 30 minutes of non-face-to-face time during this encounter.   11/25/2021 10:45 AM Wanda Houston  MRN:  [id number]  Chief Complaint: "Im doing okay"  HPI: 20 year old female seen today for follow up psychiatric evaluation. She has a psychiatric history of anxiety and depression. She is currently managed on Celexa 20 mg daily. She notes her medication is effective in managing her psychiatric conditions.  Today she is well groomed, pleasant, cooperative, and engaged in conversation. She informed Clinical research associate that she has been doing okay since her last visit. She reports that she is excited about Christmas and notes that she has gotten most of her shopping done. Patient reports that Celexa continues to be effective. She notes that she has minimal anxiety and depression. Today provider conducted a GAD 7 and patient scored a 4, at her last visit she scored a 12. Provider also conducted a PHQ 9 and patient scored a 3, at her last visit she scored a 9. Today she denies SI/HI/VAH, mania, or paranoia.  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 She notes she finds enjoyment in her job.  No medication changes made today. Patient agreeable to continue medications as prescribed. She will follow up with outpatient counseling for therapy. No other concerns noted at this time.  Visit Diagnosis:    ICD-10-CM   1. Generalized anxiety disorder with panic attacks  F41.1 citalopram (CELEXA) 20 MG tablet   F41.0        Past Psychiatric History: Anxiety and depression   Past Medical History:  Past Medical History:  Diagnosis Date   Anxiety    Depression    Vertigo    No past surgical history on file.  Family Psychiatric History: There is family history of substance abuse in her mother in the past.    Family History: No family history on file.  Social History:  Social History   Socioeconomic History   Marital status: Media planner    Spouse name: Not on file   Number of children: Not on file   Years of education: Not on file   Highest education level: Not on file  Occupational History   Not on file  Tobacco Use   Smoking status: Every Day    Packs/day: 0.25    Types: Cigarettes   Smokeless tobacco: Never  Vaping Use   Vaping Use: Every day  Substance and Sexual Activity   Alcohol use: Yes    Comment: occ   Drug use: No   Sexual activity: Yes    Birth control/protection: Implant  Other Topics Concern   Not on file  Social History Narrative   Not on file   Social Determinants of Health   Financial Resource Strain: Not on file  Food Insecurity: Not on file  Transportation Needs: Not on file  Physical Activity: Not on file  Stress: Not on file  Social Connections: Not on file    Allergies:  Allergies  Allergen Reactions   Pineapple     Tongue swells  Metabolic Disorder Labs: No results found for: HGBA1C, MPG No results found for: PROLACTIN No results found for: CHOL, TRIG, HDL, CHOLHDL, VLDL, LDLCALC No results found for: TSH  Therapeutic Level Labs: No results found for: LITHIUM No results found for: VALPROATE No components found for:  CBMZ  Current Medications: Current Outpatient Medications  Medication Sig Dispense Refill   cetirizine (ZYRTEC) 10 MG tablet Take 10 mg by mouth daily.     citalopram (CELEXA) 20 MG tablet Take 1 tablet (20 mg total) by mouth daily with breakfast. 90 tablet 3   famotidine (PEPCID) 20 MG tablet Take 1 tablet (20 mg  total) by mouth 2 (two) times daily. (Patient not taking: Reported on 12/19/2018) 30 tablet 0   fluticasone (FLONASE) 50 MCG/ACT nasal spray Place 1-2 sprays into both nostrils 2 (two) times daily.  3   MECLIZINE HCL PO Take 1 tablet by mouth daily as needed (vertigo).     No current facility-administered medications for this visit.     Musculoskeletal: Strength & Muscle Tone:  Unable to assess due to telehealth visit  Gait & Station:  Unable to assess due to telehealth visi Patient leans: N/A  Psychiatric Specialty Exam: Review of Systems  There were no vitals taken for this visit.There is no height or weight on file to calculate BMI.  General Appearance: Well Groomed  Eye Contact:  Good  Speech:  Clear and Coherent  Volume:  Normal  Mood:  Euthymic  Affect:  Appropriate and Congruent  Thought Process:  Coherent, Goal Directed, and Linear  Orientation:  Full (Time, Place, and Person)  Thought Content: WDL and Logical   Suicidal Thoughts:  No  Homicidal Thoughts:  No  Memory:  Immediate;   Good Recent;   Good Remote;   Good  Judgement:  Good  Insight:  Good  Psychomotor Activity:  Normal  Concentration:  Concentration: Good and Attention Span: Good  Recall:  Good  Fund of Knowledge: Good  Language: Good  Akathisia:  No  Handed:  Right  AIMS (if indicated): not done  Assets:  Communication Skills Desire for Improvement Financial Resources/Insurance Housing Intimacy Physical Health Resilience Social Support  ADL's:  Intact  Cognition: WNL  Sleep:  Good   Screenings: GAD-7    Flowsheet Row Video Visit from 11/25/2021 in [facility] Video Visit from 08/26/2021 in [facility]  Total GAD-7 Score 4 12      PHQ2-9    Flowsheet Row Video Visit from 11/25/2021 in [facility] Video Visit from 08/26/2021 in [facility] Counselor from 08/18/2021 in [facility]  PHQ-2 Total Score 0 1 2  PHQ-9 Total Score 3 9 10       Flowsheet Row Video Visit from 08/26/2021 in [facility] Counselor from 08/18/2021 in [facility]  C-SSRS RISK CATEGORY No Risk No Risk        Assessment and Plan: Patient notes that she is doing well on her current medication regimen. No medication changes made today. Patient agreeable to continue medications as prescribed.  1. Generalized anxiety disorder with panic attacks  Continue- citalopram (CELEXA) 20 MG tablet; Take 1 tablet (20 mg total) by mouth daily with breakfast.  Dispense: 90 tablet; Refill: 3.   Follow up in 3 months Follow up with therapy.  [facility], NP 11/25/2021, 10:45 AM

## 2022-02-10 ENCOUNTER — Telehealth (INDEPENDENT_AMBULATORY_CARE_PROVIDER_SITE_OTHER): Payer: No Payment, Other | Admitting: Psychiatry

## 2022-02-10 ENCOUNTER — Encounter (HOSPITAL_COMMUNITY): Payer: Self-pay | Admitting: Psychiatry

## 2022-02-10 DIAGNOSIS — F410 Panic disorder [episodic paroxysmal anxiety] without agoraphobia: Secondary | ICD-10-CM | POA: Diagnosis not present

## 2022-02-10 DIAGNOSIS — F411 Generalized anxiety disorder: Secondary | ICD-10-CM | POA: Diagnosis not present

## 2022-02-10 DIAGNOSIS — G479 Sleep disorder, unspecified: Secondary | ICD-10-CM | POA: Diagnosis not present

## 2022-02-10 MED ORDER — CITALOPRAM HYDROBROMIDE 20 MG PO TABS: 20.0000 mg | ORAL_TABLET | Freq: Every day | ORAL | 3 refills | Status: DC

## 2022-02-10 MED ORDER — MELATONIN 5 MG PO CAPS: 5.0000 mg | ORAL_CAPSULE | Freq: Every evening | ORAL | 3 refills | Status: DC | PRN

## 2022-02-10 NOTE — Progress Notes (Signed)
BH MD/PA/NP OP Progress Note Virtual Visit via Telephone Note  I connected with Wanda Houston on 02/10/22 at 11:00 AM EST by telephone and verified that I am speaking with the correct person using two identifiers.  Location: Patient: home Provider: Clinic   I discussed the limitations, risks, security and privacy concerns of performing an evaluation and management service by telephone and the availability of in person appointments. I also discussed with the patient that there may be a patient responsible charge related to this service. The patient expressed understanding and agreed to proceed.   I provided 30 minutes of non-face-to-face time during this encounter.    02/10/2022 10:26 AM Wanda Houston  MRN:  [id number]  Chief Complaint: "My sleep is to little or two much"  HPI: 21 year old female seen today for follow up psychiatric evaluation. She has a psychiatric history of anxiety and depression. She is currently managed on Celexa 20 mg daily. She notes her medication is somewhat effective in managing her psychiatric conditions.  Today she was unable to login virtually so her assessment was done over the phone.  During exam she was  pleasant, cooperative, and engaged in conversation. She informed Probation officer that overall she is doing well however notes her sleep is either a little too much or too little.  She notes that her sleep is dependent on her work schedule.  She informed Probation officer that when she has to work she gets 5 or less hours nightly of sleep.  Patient continues to work in Scientist, research (medical) and notes that she finds enjoyment in her job.  She informed Probation officer that her anxiety and depression are well managed.  Today provider conducted a GAD 7 and patient scored a 7, at her last visit she scored a 4. Provider also conducted a PHQ 9 and patient scored a 6, at her last visit she scored a 3.  She endorses adequate appetite.  Today she denies SI/HI/VAH, mania, or paranoia.  Today she is agreeable to  starting melatonin 5 mg nightly as needed to help manage sleep.  Potential side effects of medication and risks vs benefits of treatment vs non-treatment were explained and discussed. All questions were answered.She will continue Celexa as prescribed.  Patient will follow-up with outpatient counseling for therapy.   No other concerns noted at this time.  Visit Diagnosis:    ICD-10-CM   1. Sleep difficulties  G47.9 Melatonin 5 MG CAPS    2. Generalized anxiety disorder with panic attacks  F41.1 citalopram (CELEXA) 20 MG tablet   F41.0       Past Psychiatric History: Anxiety and depression   Past Medical History:  Past Medical History:  Diagnosis Date   Anxiety    Depression    Vertigo    History reviewed. No pertinent surgical history.  Family Psychiatric History: There is family history of substance abuse in her mother in the past.    Family History: History reviewed. No pertinent family history.  Social History:  Social History   Socioeconomic History   Marital status: Soil scientist    Spouse name: Not on file   Number of children: Not on file   Years of education: Not on file   Highest education level: Not on file  Occupational History   Not on file  Tobacco Use   Smoking status: Every Day    Packs/day: 0.25    Types: Cigarettes   Smokeless tobacco: Never  Vaping Use   Vaping Use: Every day  Substance and  Sexual Activity   Alcohol use: Yes    Comment: occ   Drug use: No   Sexual activity: Yes    Birth control/protection: Implant  Other Topics Concern   Not on file  Social History Narrative   Not on file   Social Determinants of Health   Financial Resource Strain: Not on file  Food Insecurity: Not on file  Transportation Needs: Not on file  Physical Activity: Not on file  Stress: Not on file  Social Connections: Not on file    Allergies:  Allergies  Allergen Reactions   Pineapple     Tongue swells    Metabolic Disorder Labs: No results found  for: HGBA1C, MPG No results found for: PROLACTIN No results found for: CHOL, TRIG, HDL, CHOLHDL, VLDL, LDLCALC No results found for: TSH  Therapeutic Level Labs: No results found for: LITHIUM No results found for: VALPROATE No components found for:  CBMZ  Current Medications: Current Outpatient Medications  Medication Sig Dispense Refill   Melatonin 5 MG CAPS Take 1 capsule (5 mg total) by mouth at bedtime as needed. 30 capsule 3   cetirizine (ZYRTEC) 10 MG tablet Take 10 mg by mouth daily.     citalopram (CELEXA) 20 MG tablet Take 1 tablet (20 mg total) by mouth daily with breakfast. 90 tablet 3   famotidine (PEPCID) 20 MG tablet Take 1 tablet (20 mg total) by mouth 2 (two) times daily. (Patient not taking: Reported on 12/19/2018) 30 tablet 0   fluticasone (FLONASE) 50 MCG/ACT nasal spray Place 1-2 sprays into both nostrils 2 (two) times daily.  3   MECLIZINE HCL PO Take 1 tablet by mouth daily as needed (vertigo).     No current facility-administered medications for this visit.     Musculoskeletal: Strength & Muscle Tone:  Unable to assess due to telephone visit  Gait & Station:  Unable to assess due to telephone visit Patient leans: N/A  Psychiatric Specialty Exam: Review of Systems  There were no vitals taken for this visit.There is no height or weight on file to calculate BMI.  General Appearance:  Unable to assess due to telephone visit   Eye Contact:   Unable to assess due to telephone visit   Speech:  Clear and Coherent  Volume:  Normal  Mood:  Euthymic  Affect:  Appropriate and Congruent  Thought Process:  Coherent, Goal Directed, and Linear  Orientation:  Full (Time, Place, and Person)  Thought Content: WDL and Logical   Suicidal Thoughts:  No  Homicidal Thoughts:  No  Memory:  Immediate;   Good Recent;   Good Remote;   Good  Judgement:  Good  Insight:  Good  Psychomotor Activity:   Unable to assess due to telephone visit   Concentration:  Concentration: Good  and Attention Span: Good  Recall:  Good  Fund of Knowledge: Good  Language: Good  Akathisia:   Unable to assess due to telephone visit   Handed:  Right  AIMS (if indicated): not done  Assets:  Communication Skills Desire for Improvement Financial Resources/Insurance [place] Support  ADL's:  Intact  Cognition: WNL  Sleep:  Fair   Screenings: GAD-7    Flowsheet Row Video Visit from 02/10/2022 in [facility] Video Visit from 11/25/2021 in [facility] Video Visit from 08/26/2021 in [facility]  Total GAD-7 Score 7 4 12       [phone]  Flowsheet Row Video Visit from 02/10/2022 in [facility] Video Visit from 11/25/2021 in [facility] Video Visit from 08/26/2021 in [facility] Counselor from 08/18/2021 in [facility]  PHQ-2 Total Score 2 0 1 2  PHQ-9 Total Score 6 3 9 10       Flowsheet Row Video Visit from 08/26/2021 in [facility] Counselor from 08/18/2021 in [place] No Risk No Risk        Assessment and Plan: Patient notes that her anxiety and depression are well managed however notes that her sleep fluctuates.  Today she is agreeable to starting melatonin 5 mg nightly as needed to help manage sleep.  She will continue Celexa as prescribed. 1. Generalized anxiety disorder with panic attacks  Continue- citalopram (CELEXA) 20 MG tablet; Take 1 tablet (20 mg total) by mouth daily with breakfast.  Dispense: 90 tablet; Refill: 3  2. Sleep difficulties  Start- Melatonin 5 MG CAPS; Take 1 capsule (5 mg total) by mouth at bedtime as needed.  Dispense: 30 capsule; Refill: 3  Follow up in 3 months Follow up with therapy.  Salley Slaughter, NP 02/10/2022, 10:26 AM

## 2022-03-13 ENCOUNTER — Telehealth (HOSPITAL_COMMUNITY): Payer: Self-pay | Admitting: Licensed Clinical Social Worker

## 2022-03-13 NOTE — Telephone Encounter (Signed)
LCSW phoned pt to inform her of this clinician's resignation from [facility]. Assisted pt to process thoughts/feelings re change in care. Provided info on transition to another counselor when/if pt wishes to re engage in care. Pt verbalizes understanding. Pt states appreciation for all care provided. ?

## 2022-04-28 ENCOUNTER — Telehealth (HOSPITAL_COMMUNITY): Payer: No Payment, Other | Admitting: Psychiatry

## 2022-05-03 ENCOUNTER — Telehealth (HOSPITAL_COMMUNITY): Payer: No Payment, Other | Admitting: Psychiatry

## 2022-05-03 ENCOUNTER — Encounter (HOSPITAL_COMMUNITY): Payer: Self-pay

## 2023-05-11 ENCOUNTER — Other Ambulatory Visit (HOSPITAL_COMMUNITY): Payer: Self-pay | Admitting: Psychiatry

## 2023-05-11 DIAGNOSIS — F410 Panic disorder [episodic paroxysmal anxiety] without agoraphobia: Secondary | ICD-10-CM

## 2024-01-01 ENCOUNTER — Encounter (HOSPITAL_COMMUNITY): Payer: Self-pay

## 2024-01-01 ENCOUNTER — Emergency Department (HOSPITAL_BASED_OUTPATIENT_CLINIC_OR_DEPARTMENT_OTHER): Payer: Self-pay

## 2024-01-01 ENCOUNTER — Emergency Department (HOSPITAL_COMMUNITY): Payer: Self-pay

## 2024-01-01 ENCOUNTER — Other Ambulatory Visit: Payer: Self-pay

## 2024-01-01 ENCOUNTER — Inpatient Hospital Stay (HOSPITAL_COMMUNITY)
Admission: EM | Admit: 2024-01-01 | Discharge: 2024-01-04 | DRG: 558 | Disposition: A | Discharge status: Home / Self Care | Payer: Self-pay | Attending: Student in an Organized Health Care Education/Training Program | Admitting: Student in an Organized Health Care Education/Training Program

## 2024-01-01 DIAGNOSIS — F419 Anxiety disorder, unspecified: Secondary | ICD-10-CM | POA: Diagnosis present

## 2024-01-01 DIAGNOSIS — Z91018 Allergy to other foods: Secondary | ICD-10-CM

## 2024-01-01 DIAGNOSIS — F32A Depression, unspecified: Secondary | ICD-10-CM | POA: Diagnosis present

## 2024-01-01 DIAGNOSIS — M79605 Pain in left leg: Secondary | ICD-10-CM | POA: Diagnosis present

## 2024-01-01 DIAGNOSIS — M79604 Pain in right leg: Secondary | ICD-10-CM | POA: Diagnosis present

## 2024-01-01 DIAGNOSIS — R7989 Other specified abnormal findings of blood chemistry: Secondary | ICD-10-CM

## 2024-01-01 DIAGNOSIS — M6282 Rhabdomyolysis: Principal | ICD-10-CM

## 2024-01-01 DIAGNOSIS — Z87891 Personal history of nicotine dependence: Secondary | ICD-10-CM

## 2024-01-01 DIAGNOSIS — M7989 Other specified soft tissue disorders: Secondary | ICD-10-CM

## 2024-01-01 DIAGNOSIS — R7401 Elevation of levels of liver transaminase levels: Secondary | ICD-10-CM | POA: Diagnosis present

## 2024-01-01 DIAGNOSIS — T796XXA Traumatic ischemia of muscle, initial encounter: Secondary | ICD-10-CM

## 2024-01-01 DIAGNOSIS — Z79899 Other long term (current) drug therapy: Secondary | ICD-10-CM

## 2024-01-01 DIAGNOSIS — M79661 Pain in right lower leg: Secondary | ICD-10-CM | POA: Diagnosis present

## 2024-01-01 LAB — MAGNESIUM: Magnesium: 2.5 mg/dL — ABNORMAL HIGH (ref 1.7–2.4)

## 2024-01-01 LAB — CBC WITH DIFFERENTIAL/PLATELET
Abs Immature Granulocytes: 0.04 10*3/uL (ref 0.00–0.07)
Basophils Absolute: 0.1 10*3/uL (ref 0.0–0.1)
Basophils Relative: 1 %
Eosinophils Absolute: 0.1 10*3/uL (ref 0.0–0.5)
Eosinophils Relative: 1 %
HCT: 47.2 % — ABNORMAL HIGH (ref 36.0–46.0)
Hemoglobin: 15.3 g/dL — ABNORMAL HIGH (ref 12.0–15.0)
Immature Granulocytes: 0 %
Lymphocytes Relative: 27 %
Lymphs Abs: 2.6 10*3/uL (ref 0.7–4.0)
MCH: 29.2 pg (ref 26.0–34.0)
MCHC: 32.4 g/dL (ref 30.0–36.0)
MCV: 90.1 fL (ref 80.0–100.0)
Monocytes Absolute: 0.7 10*3/uL (ref 0.1–1.0)
Monocytes Relative: 7 %
Neutro Abs: 5.9 10*3/uL (ref 1.7–7.7)
Neutrophils Relative %: 64 %
Platelets: 376 10*3/uL (ref 150–400)
RBC: 5.24 MIL/uL — ABNORMAL HIGH (ref 3.87–5.11)
RDW: 12.2 % (ref 11.5–15.5)
WBC: 9.4 10*3/uL (ref 4.0–10.5)
nRBC: 0 % (ref 0.0–0.2)

## 2024-01-01 LAB — COMPREHENSIVE METABOLIC PANEL
ALT: 75 U/L — ABNORMAL HIGH (ref 0–44)
AST: 124 U/L — ABNORMAL HIGH (ref 15–41)
Albumin: 5.1 g/dL — ABNORMAL HIGH (ref 3.5–5.0)
Alkaline Phosphatase: 55 U/L (ref 38–126)
Anion gap: 10 (ref 5–15)
BUN: 12 mg/dL (ref 6–20)
CO2: 24 mmol/L (ref 22–32)
Calcium: 9.7 mg/dL (ref 8.9–10.3)
Chloride: 104 mmol/L (ref 98–111)
Creatinine, Ser: 0.64 mg/dL (ref 0.44–1.00)
GFR, Estimated: >60 mL/min (ref >60)
Glucose, Bld: 98 mg/dL (ref 70–99)
Potassium: 4.3 mmol/L (ref 3.5–5.1)
Sodium: 138 mmol/L (ref 135–145)
Total Bilirubin: 0.6 mg/dL (ref 0.0–1.2)
Total Protein: 9 g/dL — ABNORMAL HIGH (ref 6.5–8.1)

## 2024-01-01 LAB — URINALYSIS, ROUTINE W REFLEX MICROSCOPIC
Bilirubin Urine: NEGATIVE
Glucose, UA: NEGATIVE mg/dL
Hgb urine dipstick: NEGATIVE
Ketones, ur: NEGATIVE mg/dL
Leukocytes,Ua: NEGATIVE
Nitrite: NEGATIVE
Protein, ur: NEGATIVE mg/dL
Specific Gravity, Urine: 1.009 (ref 1.005–1.030)
pH: 7 (ref 5.0–8.0)

## 2024-01-01 LAB — CK: Total CK: 5552 U/L — ABNORMAL HIGH (ref 38–234)

## 2024-01-01 LAB — PREGNANCY, URINE: Preg Test, Ur: NEGATIVE

## 2024-01-01 MED ORDER — OXYCODONE-ACETAMINOPHEN 5-325 MG PO TABS
1.0000 | ORAL_TABLET | Freq: Once | ORAL | Status: AC
  Administered 2024-01-01: 1 via ORAL
  Filled 2024-01-01: qty 1

## 2024-01-01 MED ORDER — ONDANSETRON 4 MG PO TBDP
4.0000 mg | ORAL_TABLET | Freq: Once | ORAL | Status: AC
  Administered 2024-01-01: 4 mg via ORAL
  Filled 2024-01-01: qty 1

## 2024-01-01 MED ORDER — LACTATED RINGERS IV BOLUS
2000.0000 mL | Freq: Once | INTRAVENOUS | Status: AC
  Administered 2024-01-01: 2000 mL via INTRAVENOUS

## 2024-01-01 NOTE — ED Provider Triage Note (Signed)
 Emergency Medicine Provider Triage Evaluation Note  Wanda Houston , a 23 y.o. female  was evaluated in triage.  Pt complains of right calf pain on Sunday during/after calf raise exercises. Denies any chest pain or SOB. Pain with walking. Reports some swelling to the calf as well. Reports the pain was spreading today which prompted her to come into the ER. Denies any fevers. She has been increasing her water intake.  Review of Systems  Positive:  Negative:   Physical Exam  BP 138/88   Pulse 83   Temp 98.2 F (36.8 C) (Oral)   Resp 16   Ht 5' 8 (1.727 m)   Wt 88.5 kg   SpO2 100%   BMI 29.65 kg/m  Gen:   Awake, no distress   Resp:  Normal effort  MSK:   Moves extremities without difficulty  Other:  Palpable DP and PT pulses. Does have some calf swelling noted with tenderness. Compartments are soft.   Medical Decision Making  Medically screening exam initiated at 3:43 PM.  Appropriate orders placed.  Wanda Houston was informed that the remainder of the evaluation will be completed by another provider, this initial triage assessment does not replace that evaluation, and the importance of remaining in the ED until their evaluation is complete.  Likely a muscular injury, but out of precaution with order DVT and labs. NV intact distally. Denies any chest pain or SOB.    Bernis Ernst, PA-C 01/01/24 1551

## 2024-01-01 NOTE — ED Notes (Signed)
 IV placed, RN educated pt about MD order for IV fluid, RN informed pt and family if they wanted to leave at any time to let staff know so IV can be removed.

## 2024-01-01 NOTE — Progress Notes (Signed)
 Lower extremity venous duplex completed. Please see CV Procedures for preliminary results.  Shona Simpson, RVT 01/01/24 4:35 PM

## 2024-01-01 NOTE — ED Triage Notes (Signed)
 Patient reports she was doing a calf workout Sunday. Reports since she has had worsening right calf pain. Patient has been unable to bear weight on leg.

## 2024-01-02 ENCOUNTER — Encounter (HOSPITAL_COMMUNITY): Payer: Self-pay | Admitting: Internal Medicine

## 2024-01-02 DIAGNOSIS — M79605 Pain in left leg: Secondary | ICD-10-CM

## 2024-01-02 DIAGNOSIS — M6282 Rhabdomyolysis: Secondary | ICD-10-CM | POA: Diagnosis present

## 2024-01-02 DIAGNOSIS — T796XXA Traumatic ischemia of muscle, initial encounter: Secondary | ICD-10-CM

## 2024-01-02 DIAGNOSIS — R7401 Elevation of levels of liver transaminase levels: Secondary | ICD-10-CM | POA: Diagnosis present

## 2024-01-02 DIAGNOSIS — M79604 Pain in right leg: Secondary | ICD-10-CM | POA: Diagnosis present

## 2024-01-02 DIAGNOSIS — R7989 Other specified abnormal findings of blood chemistry: Secondary | ICD-10-CM

## 2024-01-02 LAB — BASIC METABOLIC PANEL
Anion gap: 9 (ref 5–15)
BUN: 11 mg/dL (ref 6–20)
CO2: 24 mmol/L (ref 22–32)
Calcium: 8.7 mg/dL — ABNORMAL LOW (ref 8.9–10.3)
Chloride: 101 mmol/L (ref 98–111)
Creatinine, Ser: 0.73 mg/dL (ref 0.44–1.00)
GFR, Estimated: >60 mL/min (ref >60)
Glucose, Bld: 91 mg/dL (ref 70–99)
Potassium: 3.8 mmol/L (ref 3.5–5.1)
Sodium: 134 mmol/L — ABNORMAL LOW (ref 135–145)

## 2024-01-02 LAB — COMPREHENSIVE METABOLIC PANEL
ALT: 65 U/L — ABNORMAL HIGH (ref 0–44)
AST: 96 U/L — ABNORMAL HIGH (ref 15–41)
Albumin: 4 g/dL (ref 3.5–5.0)
Alkaline Phosphatase: 53 U/L (ref 38–126)
Anion gap: 8 (ref 5–15)
BUN: 12 mg/dL (ref 6–20)
CO2: 26 mmol/L (ref 22–32)
Calcium: 9.3 mg/dL (ref 8.9–10.3)
Chloride: 104 mmol/L (ref 98–111)
Creatinine, Ser: 0.7 mg/dL (ref 0.44–1.00)
GFR, Estimated: >60 mL/min (ref >60)
Glucose, Bld: 106 mg/dL — ABNORMAL HIGH (ref 70–99)
Potassium: 3.9 mmol/L (ref 3.5–5.1)
Sodium: 138 mmol/L (ref 135–145)
Total Bilirubin: 0.5 mg/dL (ref 0.0–1.2)
Total Protein: 7 g/dL (ref 6.5–8.1)

## 2024-01-02 LAB — RAPID URINE DRUG SCREEN, HOSP PERFORMED
Amphetamines: NOT DETECTED
Barbiturates: NOT DETECTED
Benzodiazepines: NOT DETECTED
Cocaine: NOT DETECTED
Opiates: NOT DETECTED
Tetrahydrocannabinol: NOT DETECTED

## 2024-01-02 LAB — CBC WITH DIFFERENTIAL/PLATELET
Abs Immature Granulocytes: 0.04 10*3/uL (ref 0.00–0.07)
Basophils Absolute: 0.1 10*3/uL (ref 0.0–0.1)
Basophils Relative: 1 %
Eosinophils Absolute: 0.2 10*3/uL (ref 0.0–0.5)
Eosinophils Relative: 2 %
HCT: 40.4 % (ref 36.0–46.0)
Hemoglobin: 13.1 g/dL (ref 12.0–15.0)
Immature Granulocytes: 1 %
Lymphocytes Relative: 35 %
Lymphs Abs: 3 10*3/uL (ref 0.7–4.0)
MCH: 29.2 pg (ref 26.0–34.0)
MCHC: 32.4 g/dL (ref 30.0–36.0)
MCV: 90.2 fL (ref 80.0–100.0)
Monocytes Absolute: 0.8 10*3/uL (ref 0.1–1.0)
Monocytes Relative: 9 %
Neutro Abs: 4.6 10*3/uL (ref 1.7–7.7)
Neutrophils Relative %: 52 %
Platelets: 311 10*3/uL (ref 150–400)
RBC: 4.48 MIL/uL (ref 3.87–5.11)
RDW: 12.4 % (ref 11.5–15.5)
WBC: 8.7 10*3/uL (ref 4.0–10.5)
nRBC: 0 % (ref 0.0–0.2)

## 2024-01-02 LAB — CK
Total CK: 4345 U/L — ABNORMAL HIGH (ref 38–234)
Total CK: 4510 U/L — ABNORMAL HIGH (ref 38–234)

## 2024-01-02 LAB — PHOSPHORUS: Phosphorus: 4.1 mg/dL (ref 2.5–4.6)

## 2024-01-02 LAB — ETHANOL: Alcohol, Ethyl (B): <10 mg/dL (ref <10)

## 2024-01-02 LAB — PROTIME-INR
INR: 1 (ref 0.8–1.2)
Prothrombin Time: 13.1 s (ref 11.4–15.2)

## 2024-01-02 LAB — TSH: TSH: 3.05 u[IU]/mL (ref 0.350–4.500)

## 2024-01-02 LAB — MAGNESIUM: Magnesium: 2.1 mg/dL (ref 1.7–2.4)

## 2024-01-02 MED ORDER — ACETAMINOPHEN 650 MG RE SUPP: 650.0000 mg | Freq: Four times a day (QID) | RECTAL | Status: DC | PRN

## 2024-01-02 MED ORDER — ACETAMINOPHEN 325 MG PO TABS
650.0000 mg | ORAL_TABLET | Freq: Four times a day (QID) | ORAL | Status: DC | PRN
  Administered 2024-01-02: 650 mg via ORAL
  Filled 2024-01-02: qty 2

## 2024-01-02 MED ORDER — NALOXONE HCL 0.4 MG/ML IJ SOLN: 0.4000 mg | INTRAMUSCULAR | Status: DC | PRN

## 2024-01-02 MED ORDER — FENTANYL CITRATE PF 50 MCG/ML IJ SOSY
50.0000 ug | PREFILLED_SYRINGE | INTRAMUSCULAR | Status: DC | PRN
  Administered 2024-01-02: 50 ug via INTRAVENOUS
  Filled 2024-01-02: qty 1

## 2024-01-02 MED ORDER — NICOTINE 14 MG/24HR TD PT24
14.0000 mg | MEDICATED_PATCH | Freq: Every day | TRANSDERMAL | Status: DC | PRN
Start: 2024-01-02 — End: 2024-01-04

## 2024-01-02 MED ORDER — HYDROMORPHONE HCL 1 MG/ML IJ SOLN
0.5000 mg | INTRAMUSCULAR | Status: DC | PRN
  Administered 2024-01-02 (×4): 0.5 mg via INTRAVENOUS
  Filled 2024-01-02 (×4): qty 1

## 2024-01-02 MED ORDER — MELATONIN 3 MG PO TABS: 3.0000 mg | ORAL_TABLET | Freq: Every evening | ORAL | Status: DC | PRN

## 2024-01-02 MED ORDER — ONDANSETRON HCL 4 MG/2ML IJ SOLN
4.0000 mg | Freq: Four times a day (QID) | INTRAMUSCULAR | Status: DC | PRN
  Administered 2024-01-02: 4 mg via INTRAVENOUS
  Filled 2024-01-02: qty 2

## 2024-01-02 MED ORDER — LACTATED RINGERS IV SOLN
INTRAVENOUS | Status: DC
Start: 2024-01-02 — End: 2024-01-04

## 2024-01-02 MED ORDER — ENOXAPARIN SODIUM 40 MG/0.4ML IJ SOSY
40.0000 mg | PREFILLED_SYRINGE | INTRAMUSCULAR | Status: DC
  Administered 2024-01-02 – 2024-01-04 (×3): 40 mg via SUBCUTANEOUS
  Filled 2024-01-02 (×3): qty 0.4

## 2024-01-02 MED ORDER — OXYCODONE-ACETAMINOPHEN 5-325 MG PO TABS
1.0000 | ORAL_TABLET | ORAL | Status: DC | PRN
  Administered 2024-01-03 – 2024-01-04 (×5): 1 via ORAL
  Filled 2024-01-02 (×6): qty 1

## 2024-01-02 NOTE — Plan of Care (Signed)

## 2024-01-02 NOTE — ED Provider Notes (Signed)
 [facility] [place] AT [facility] [place] Provider Note   CSN: [id number] Arrival date & time: 01/01/24  1340    History  Chief Complaint  Patient presents with   Leg Pain    Wanda [place] is [place] 23 y.o. female here for evaluation of right calf pain.  States she is doing calf raises on Sunday.  She developed severe pain to her right calf.  Has been having to use crutches.  She feels like her left leg is swollen, has mildly improved.  No redness, numbness or weakness.  No history of similar.  No chest pain or shortness of breath.  No pain to foot, over Achilles.  No redness to area.  HPI     Home Medications Prior to Admission medications   Medication Sig Start Date End Date Taking? Authorizing Provider  cetirizine (ZYRTEC) 10 MG tablet Take 10 mg by mouth daily. Patient not taking: Reported on 01/02/2024    [provider]  citalopram  (CELEXA ) 20 MG tablet Take 1 tablet (20 mg total) by mouth daily with breakfast. Patient not taking: Reported on 01/02/2024 02/10/22   Wanda Bering, NP  famotidine  (PEPCID ) 20 MG tablet Take 1 tablet (20 mg total) by mouth 2 (two) times daily. Patient not taking: Reported on 12/19/2018 12/13/18   Wanda Kegley A, PA-C  fluticasone (FLONASE) 50 MCG/ACT nasal spray Place 1-2 sprays into both nostrils 2 (two) times daily. Patient not taking: Reported on 01/02/2024 10/02/18   [provider]  MECLIZINE HCL PO Take 1 tablet by mouth daily as needed (vertigo). Patient not taking: Reported on 01/02/2024    [provider]  Melatonin 5 MG CAPS Take 1 capsule (5 mg total) by mouth at bedtime as needed. Patient not taking: Reported on 01/02/2024 02/10/22   Wanda Bering, NP      Allergies    Pineapple    Review of Systems   Review of Systems  Constitutional: Negative.   HENT: Negative.    Respiratory: Negative.    Cardiovascular: Negative.   Gastrointestinal: Negative.   Genitourinary: Negative.    Musculoskeletal:        Right calf pain  Skin: Negative.   Neurological: Negative.   All other systems reviewed and are negative.   Physical Exam Updated Vital Signs BP 125/68 (BP Location: Left Arm)   Pulse 89   Temp 97.9 F (36.6 C) (Oral)   Resp 18   Ht 5\' 8"  (1.727 m)   Wt 88.5 kg   SpO2 100%   BMI 29.65 kg/m  Physical Exam Vitals and nursing note reviewed.  Constitutional:      General: She is not in acute distress.    Appearance: She is well-developed. She is not ill-appearing, toxic-appearing or diaphoretic.  HENT:     Head: Atraumatic.  Eyes:     Pupils: Pupils are equal, round, and reactive to light.  Cardiovascular:     Rate and Rhythm: Normal rate.     Pulses: Normal pulses.          Dorsalis pedis pulses are 2+ on Wanda right side and 2+ on Wanda left side.  Pulmonary:     Effort: No respiratory distress.  Abdominal:     General: There is no distension.  Musculoskeletal:        General: Normal range of motion.     Cervical back: Normal range of motion.     Comments: No bony tenderness, full range of motion.  Pain to  right calf worse with dorsiflexion.  Pain to proximal posterior calf.  Nontender over Achilles, popliteal fossa.  Compartments are soft.  2+ DP pulses.   Skin:    General: Skin is warm and dry.     Comments: No erythema, fluctuance, induration, rashes or lesions.  Neurological:     General: No focal deficit present.     Mental Status: She is alert.     Sensory: Sensation is intact.     Motor: Motor function is intact.     Comments: Intact sensation Ambulatory with limb  Psychiatric:        Mood and Affect: Mood normal.    ED Results / Procedures / Treatments   Labs (all labs ordered are listed, but only abnormal results are displayed) Labs Reviewed  CK - Abnormal; Notable for Wanda following components:      Result Value   Total CK 5,552 (*)    All other components within normal limits  MAGNESIUM - Abnormal; Notable for Wanda following  components:   Magnesium 2.5 (*)    All other components within normal limits  CBC WITH DIFFERENTIAL/PLATELET - Abnormal; Notable for Wanda following components:   RBC 5.24 (*)    Hemoglobin 15.3 (*)    HCT 47.2 (*)    All other components within normal limits  COMPREHENSIVE METABOLIC PANEL - Abnormal; Notable for Wanda following components:   Total Protein 9.0 (*)    Albumin 5.1 (*)    AST 124 (*)    ALT 75 (*)    All other components within normal limits  URINALYSIS, ROUTINE W REFLEX MICROSCOPIC - Abnormal; Notable for Wanda following components:   Color, Urine STRAW (*)    All other components within normal limits  PREGNANCY, URINE  MAGNESIUM  PROTIME-INR  CBC WITH DIFFERENTIAL/PLATELET  COMPREHENSIVE METABOLIC PANEL  PHOSPHORUS  CK  RAPID URINE DRUG SCREEN, HOSP PERFORMED  TSH  ETHANOL    EKG None  Radiology VAS US  LOWER EXTREMITY VENOUS (DVT) (ONLY [facility] & [facility]) Result Date: 01/01/2024  Lower Venous DVT Study Patient Name:  Wanda Houston  Date of Exam:   01/01/2024 Medical Rec #: [id number]        Accession #:    [id number] Date of Birth: September 25, 2001        Patient Gender: F Patient Age:   50 years Exam Location:  [facility] Procedure:      VAS US  LOWER EXTREMITY VENOUS (DVT) Referring Phys: Wanda [place] --------------------------------------------------------------------------------  Indications: Pain.  Risk Factors: None identified. Comparison Study: None. Performing Technologist: Wanda [place]  Examination Guidelines: [place] complete evaluation includes B-mode imaging, spectral Doppler, color Doppler, and power Doppler as needed of all accessible portions of each vessel. Bilateral testing is considered an integral part of Houston complete examination. Limited examinations for reoccurring indications may be performed as noted. Wanda reflux portion of Wanda exam is performed with Wanda patient in reverse Trendelenburg.  +---------+---------------+---------+-----------+----------+--------------+  RIGHT    CompressibilityPhasicitySpontaneityPropertiesThrombus Aging +---------+---------------+---------+-----------+----------+--------------+ CFV      Full           Yes      Yes                                 +---------+---------------+---------+-----------+----------+--------------+ SFJ      Full                                                        +---------+---------------+---------+-----------+----------+--------------+  FV Prox  Full                                                        +---------+---------------+---------+-----------+----------+--------------+ FV Mid   Full                                                        +---------+---------------+---------+-----------+----------+--------------+ FV DistalFull                                                        +---------+---------------+---------+-----------+----------+--------------+ PFV      Full                                                        +---------+---------------+---------+-----------+----------+--------------+ POP      Full           Yes      Yes                                 +---------+---------------+---------+-----------+----------+--------------+ PTV      Full                                                        +---------+---------------+---------+-----------+----------+--------------+ PERO     Full                                                        +---------+---------------+---------+-----------+----------+--------------+   +----+---------------+---------+-----------+----------+--------------+ LEFTCompressibilityPhasicitySpontaneityPropertiesThrombus Aging +----+---------------+---------+-----------+----------+--------------+ CFV Full           Yes      Yes                                 +----+---------------+---------+-----------+----------+--------------+     Summary: RIGHT: - There is no evidence of deep vein thrombosis in  Wanda lower extremity.  - No cystic structure found in Wanda popliteal fossa.  LEFT: - No evidence of common femoral vein obstruction.   *See table(s) above for measurements and observations. Electronically signed by Delaney Fearing on 01/01/2024 at 9:27:04 PM.    Final     Procedures .Critical Care  Performed by: Dickson Founds, PA-C Authorized by: Dickson Founds, PA-C   Critical care provider statement:    Critical care time (minutes):  35   Critical care was necessary to treat or prevent imminent or life-threatening deterioration of Wanda following conditions:  Metabolic  crisis     Medications Ordered in ED Medications  acetaminophen  (TYLENOL ) tablet 650 mg (has no administration in time range)    Or  acetaminophen  (TYLENOL ) suppository 650 mg (has no administration in time range)  enoxaparin  (LOVENOX ) injection 40 mg (has no administration in time range)  melatonin tablet 3 mg (has no administration in time range)  ondansetron  (ZOFRAN ) injection 4 mg (has no administration in time range)  lactated ringers  infusion ( Intravenous New Bag/Given 01/02/24 0243)  naloxone  (NARCAN ) injection 0.4 mg (has no administration in time range)  oxyCODONE -acetaminophen  (PERCOCET/ROXICET) 5-325 MG per tablet 1 tablet (has no administration in time range)  fentaNYL  (SUBLIMAZE ) injection 50 mcg (50 mcg Intravenous Given 01/02/24 0238)  nicotine  (NICODERM CQ  - dosed in mg/24 hours) patch 14 mg (has no administration in time range)  oxyCODONE -acetaminophen  (PERCOCET/ROXICET) 5-325 MG per tablet 1 tablet (1 tablet Oral Given 01/01/24 1556)  ondansetron  (ZOFRAN -ODT) disintegrating tablet 4 mg (4 mg Oral Given 01/01/24 1745)  lactated ringers  bolus 2,000 mL (0 mLs Intravenous Stopped 01/02/24 0119)    ED Course/ Medical Decision Making/ [place]&P Clinical Course as of 01/02/24 0310  Wed Jan 02, 2024  0130 Dr. Brock Canner with medicine to see for admission. [BH]    Clinical Course User Index [BH] Kalijah Zeiss  A, PA-C  85 old here for evaluation of right calf pain after strenuous workout doing calf raises on Sunday, 2 days PTA.  Has been unable to ambulate due to pain.  No history of PE or DVT.  No chest pain or shortness of breath.  No redness, warmth to area.  She is diffusely tender to her medial and proximal posterior right calf.  Possible slight swelling to right calf compared to left.  She is neurovascularly intact.  Pain worse with dorsiflexion at right foot.  No pain to Achilles, negative Thompson test.  Her compartments are soft I low suspicion for compartment syndrome.  Labs and imaging personally viewed and interpreted:  CBC without leukocytosis Metabolic panel mild elevation in LFTs--denies chronic EtOH use, abdominal pain, nausea or vomiting.  I suspect elevation likely due to rhabdomyolysis UA negative for infection Pregnancy test negative Magnesium 2.5 CK 5552  Patient given 2 L IV fluids, pain management.  Will admit to medicine for further workup.  Patient agreeable.   Discussed with Dr. Brock Canner with medicine who is agreeable for admission  Wanda patient appears reasonably stabilized for admission considering Wanda current resources, flow, and capabilities available in Wanda ED at this time, and I doubt any other [facility] requiring further screening and/or treatment in Wanda ED prior to admission.   Low suspicion for VTE, ischemia, Bacterial infectious process, septic joint, compartment syndrome, fracture, dislocation, necrotizing infection.                                   Medical Decision Making Amount and/or Complexity of Data Reviewed Independent Historian: friend External Data Reviewed: labs, radiology and notes. Labs: ordered. Decision-making details documented in ED Course. Radiology: ordered and independent interpretation performed. Decision-making details documented in ED Course.  Risk OTC drugs. Prescription drug management. Parenteral controlled substances. Decision  regarding hospitalization. Diagnosis or treatment significantly limited by social determinants of health.           Final Clinical Impression(s) / ED Diagnoses Final diagnoses:  Non-traumatic rhabdomyolysis  Elevated LFTs    Rx / DC Orders ED Discharge Orders  None         Jaremy Nosal A, PA-C 01/02/24 0310    Kelsey Patricia, MD 01/02/24 [id number]

## 2024-01-02 NOTE — ED Notes (Signed)
Pt. Assisted to bathroom in wheelchair for safety. 

## 2024-01-02 NOTE — Progress Notes (Signed)
 PROGRESS NOTE  Wanda [place]  WGN:[id number] DOB: 18-May-2001 DOA: 01/01/2024 PCP: Nohemi Batters, MD  Consultants  Brief Narrative: Wanda [place] is a 23 y.o. female with no reported significant past medical history who is admitted to [facility] on 01/01/2024 with concern for rhabdomyolysis after presenting from home to [facility] ED complaining of bilateral lower extremity discomfort.  Found to have CK level greater than 5000 and diagnosed with rhabdomyolysis.  Being treated with fluids and condition   Assessment & Plan: #) Rhabdomyolysis:  -Secondary to extensive and rather severe sinus/upper respiratory illness last week.  Patient had a hard workout on Sunday which she had not been working on for quite some time previously.  Followed by a hot yoga session after that and not as much oral hydration as usual.  Rhabdo secondary to this. -CK greater than 5000 on admission. -Downtrending subsequent check to 4300. -Received fluid bolus in the ER.  Also with ongoing fluids at 150 cc/h.  These were stopped but I will continue them through tomorrow. -Urinalysis normal. -She is young and able to compensate and it is not surprising that her function is also normal.  No hypercalcemia or hyperkalemia noted. -Repeat BMP and CK for this afternoon as well.  Right calf pain: - Present now for about 48 hours.  Work-out was Sunday.  Pain started Monday and progressed through yesterday, when she came to ED.  - Did hit her calf about a week ago on car door she tells me today and had some bruising there but that resolved and I don't appreciate any bruising today on exam.  -Dopplers negative in the emergency department for DVT.  - More painful w/ palpation compared to Left calf.  No notable swelling that I can appreciate compared to Left calf.  Tenderness mostly in posterior aspect of her right calf in the gastroc region. She is able to dorsiflex to about 45 degrees and plantar flex fully.   -Denies any  changes to sensation/numbness/paresthesias.   -She has good sensation throughout her calf and ankle and entire foot distal to her calf pain. -No bruising or redness noted.  Pedal pulses are intact.  No coolness to touch or temperature change noted.   -Obvious concern is compartment syndrome.  Based on my exam today, her calf is somewhat more tight on the Right than the left, but rest of exam reassuring.   - I did discuss this with her and to let us /nursing staff know immediately if she begins to have any of the above symptoms.  Will also follow CK as above.       DVT prophylaxis:  enoxaparin  (LOVENOX ) injection 40 mg Start: 01/02/24 0800  Code Status:   Code Status: Full Code Family Communication: Girlfriend was on the phone on speaker while I was with patient Level of care: Telemetry Status is: Inpatient Remains inpatient appropriate because: Ongoing monitoring the rhabdo.  Consults called:  Subjective: Patient with some continued calf pain tries to walk around the room.  Otherwise reports she is in her usual state of health.  She is a little nervous about having to come to the hospital and reports she has generalized health anxiety overall.  Drinking well.  No fevers.  No chills.  No chest pain or trouble breathing.  Objective: Vitals:   01/02/24 1130 01/02/24 1400 01/02/24 1445 01/02/24 1533  BP:   111/68   Pulse:  63 63   Resp:   18   Temp: 98 F (36.7 C)  98.3 F (36.8 C)  TempSrc: Oral     SpO2:  99% 98%   Weight:      Height:       No intake or output data in the 24 hours ending 01/02/24 1556 Filed Weights   01/01/24 1506  Weight: 88.5 kg   Body mass index is 29.65 kg/m.  Gen: 23 y.o. female in no apparent distress.  Nontoxic Pulm: Non-labored breathing.  Clear to auscultation bilaterally.  CV: Regular rate and rhythm. No murmur, rub, or gallop. No JVD GI: Abdomen soft, non-tender, non-distended, with normoactive bowel sounds. No organomegaly or masses felt. Ext:  Warm, no deformities.  Left leg and calf within normal limits.  Right calf with some tenderness mostly in the posterior gastroc region with deep palpation.  She is able to dorsiflex to about 45 degrees and plantar flex fully.  Sensation is fully intact calf, tender lower extremity, but throughout.  Pulses remain strong bilaterally and equal.  No color changes to skin. Skin: No rashes, lesions  Neuro: Alert and oriented. No focal neurological deficits. Psych: Calm  Judgement and insight appear normal. Mood & affect appropriate.     I have personally reviewed the following labs and images: CBC: Recent Labs  Lab 01/01/24 1559 01/02/24 0241  WBC 9.4 8.7  NEUTROABS 5.9 4.6  HGB 15.3* 13.1  HCT 47.2* 40.4  MCV 90.1 90.2  PLT 376 311   BMP &GFR Recent Labs  Lab 01/01/24 1559 01/02/24 0241  NA 138 138  K 4.3 3.9  CL 104 104  CO2 24 26  GLUCOSE 98 106*  BUN 12 12  CREATININE 0.64 0.70  CALCIUM 9.7 9.3  MG 2.5* 2.1  PHOS  --  4.1   Estimated Creatinine Clearance: 126.2 mL/min (by C-G formula based on SCr of 0.7 mg/dL). Liver & Pancreas: Recent Labs  Lab 01/01/24 1559 01/02/24 0241  AST 124* 96*  ALT 75* 65*  ALKPHOS 55 53  BILITOT 0.6 0.5  PROT 9.0* 7.0  ALBUMIN 5.1* 4.0   No results for input(s): "LIPASE", "AMYLASE" in the last 168 hours. No results for input(s): "AMMONIA" in the last 168 hours. Diabetic: No results for input(s): "HGBA1C" in the last 72 hours. No results for input(s): "GLUCAP" in the last 168 hours. Cardiac Enzymes: Recent Labs  Lab 01/01/24 1559 01/02/24 0241  CKTOTAL 5,552* 4,345*   No results for input(s): "PROBNP" in the last 8760 hours. Coagulation Profile: Recent Labs  Lab 01/02/24 0241  INR 1.0   Thyroid Function Tests: Recent Labs    01/02/24 0241  TSH 3.050   Lipid Profile: No results for input(s): "CHOL", "HDL", "LDLCALC", "TRIG", "CHOLHDL", "LDLDIRECT" in the last 72 hours. Anemia Panel: No results for input(s):  "VITAMINB12", "FOLATE", "FERRITIN", "TIBC", "IRON", "RETICCTPCT" in the last 72 hours. Urine analysis:    Component Value Date/Time   COLORURINE STRAW (A) 01/01/2024 1559   APPEARANCEUR CLEAR 01/01/2024 1559   LABSPEC 1.009 01/01/2024 1559   PHURINE 7.0 01/01/2024 1559   GLUCOSEU NEGATIVE 01/01/2024 1559   HGBUR NEGATIVE 01/01/2024 1559   BILIRUBINUR NEGATIVE 01/01/2024 1559   KETONESUR NEGATIVE 01/01/2024 1559   PROTEINUR NEGATIVE 01/01/2024 1559   NITRITE NEGATIVE 01/01/2024 1559   LEUKOCYTESUR NEGATIVE 01/01/2024 1559   Sepsis Labs: Invalid input(s): "PROCALCITONIN", "LACTICIDVEN"  Microbiology: No results found for this or any previous visit (from the past 240 hours).  Radiology Studies: VAS US  LOWER EXTREMITY VENOUS (DVT) (ONLY [facility] & [facility]) Result Date: 01/01/2024  Lower Venous DVT  Study Patient Name:  ALYANAH BORSTAD  Date of Exam:   01/01/2024 Medical Rec #: [id number]        Accession #:    [id number] Date of Birth: [birth year]        Patient Gender: F Patient Age:   68 years Exam Location:  [facility] Procedure:      VAS US  LOWER EXTREMITY VENOUS (DVT) Referring Phys: RILEY RANSOM --------------------------------------------------------------------------------  Indications: Pain.  Risk Factors: None identified. Comparison Study: None. Performing Technologist: Estanislao Heimlich  Examination Guidelines: A complete evaluation includes B-mode imaging, spectral Doppler, color Doppler, and power Doppler as needed of all accessible portions of each vessel. Bilateral testing is considered an integral part of a complete examination. Limited examinations for reoccurring indications may be performed as noted. The reflux portion of the exam is performed with the patient in reverse Trendelenburg.  +---------+---------------+---------+-----------+----------+--------------+ RIGHT    CompressibilityPhasicitySpontaneityPropertiesThrombus Aging  +---------+---------------+---------+-----------+----------+--------------+ CFV      Full           Yes      Yes                                 +---------+---------------+---------+-----------+----------+--------------+ SFJ      Full                                                        +---------+---------------+---------+-----------+----------+--------------+ FV Prox  Full                                                        +---------+---------------+---------+-----------+----------+--------------+ FV Mid   Full                                                        +---------+---------------+---------+-----------+----------+--------------+ FV DistalFull                                                        +---------+---------------+---------+-----------+----------+--------------+ PFV      Full                                                        +---------+---------------+---------+-----------+----------+--------------+ POP      Full           Yes      Yes                                 +---------+---------------+---------+-----------+----------+--------------+ PTV      Full                                                        +---------+---------------+---------+-----------+----------+--------------+  PERO     Full                                                        +---------+---------------+---------+-----------+----------+--------------+   +----+---------------+---------+-----------+----------+--------------+ LEFTCompressibilityPhasicitySpontaneityPropertiesThrombus Aging +----+---------------+---------+-----------+----------+--------------+ CFV Full           Yes      Yes                                 +----+---------------+---------+-----------+----------+--------------+     Summary: RIGHT: - There is no evidence of deep vein thrombosis in the lower extremity.  - No cystic structure found in the popliteal fossa.   LEFT: - No evidence of common femoral vein obstruction.   *See table(s) above for measurements and observations. Electronically signed by Delaney Fearing on 01/01/2024 at 9:27:04 PM.    Final     Scheduled Meds:  enoxaparin  (LOVENOX ) injection  40 mg Subcutaneous Q24H   Continuous Infusions:  lactated ringers  150 mL/hr at 01/02/24 1523     LOS: 0 days   55 minutes with more than 50% spent in reviewing records, counseling patient/family and coordinating care.  Trenton Frock, MD [facility] [email or web address] 01/02/2024, 3:56 PM

## 2024-01-02 NOTE — ED Notes (Signed)
 Assisted pt to the bathroom. Urine sample sent to lab

## 2024-01-02 NOTE — H&P (Signed)
 History and Physical      Wanda Houston NWG:[id number] DOB: 08/16/2001 DOA: 01/01/2024; DOS: 01/02/2024  PCP: Nohemi Batters, MD  Patient coming from: home   I have personally briefly reviewed patient's old medical records in [facility]  Chief Complaint: Bilateral leg pain  HPI: Wanda Houston is a 23 y.o. female with no reported significant past medical history who is admitted to [facility] on 01/01/2024 with concern for rhabdomyolysis after presenting from home to [facility] ED complaining of bilateral lower extremity discomfort.   Patient reports 1 day of progressive crampy discomfort involving the bilateral lower extremities distal to the bilateral knees.  She reports that this occurred after vigorous exercise on Sunday, 12/30/2023, which was leg day at the gym.  Denies any associated acute focal weakness nor any associated acute focal numbness or paresthesias.  Aside from the aforementioned recent exercise, no recent trauma to the lower extremities.  Not on a statin medication as an outpatient.  Denies any recent change in urine coloration, including no recent gross hematuria.  Not associate with any chest pain, shortness of breath, or hemoptysis.  Denies any recent abdominal discomfort or any recent dysuria.     ED Course:  Vital signs in the ED were notable for the following: Afebrile; heart rates in the 80s to low 100s; systolic blood pressures in the 120s to 140s; respiratory rate 16-18, oxygen saturation 95 to 100% on room air.  Labs were notable for the following: CMP was notable for the following: Sodium 138, potassium 4.3, bicarbonate 24, creatinine 0.61 compared to most recent prior value of 0.73 in December 2019, AST 124 compared to most recent prior value of 20 in December 2019, ALT 75 compared to 18 in December 2018.  Otherwise, presenting liver enzymes are found to be within normal limits.  CPK level 5552.  CBC notable for evidence of 9400, hemoglobin 15.3,  platelet count 376.  Urinalysis was straw-colored and showed no evidence of white blood cells and was leukocyte esterase/nitrate negative, also demonstrated no evidence of hemoglobin or RBCs.  Per my interpretation, EKG in ED demonstrated the following: No EKG performed in the ED today.  Imaging in the ED, per corresponding formal radiology read, was notable for the following: Venous ultrasound the bilateral lower extremities showed no evidence of acute DVT.  While in the ED, the following were administered: Zofran  4 mg ODT x 1, Percocet 5/325 mg p.o. x 1 dose, lactated Ringer 's x 2 intervals.  Subsequently, the patient was admitted for further evaluation management of potential early rhabdomyolysis.    Review of Systems: As per HPI otherwise 10 point review of systems negative.   Past Medical History:  Diagnosis Date   Anxiety    Depression    Vertigo     History reviewed. No pertinent surgical history.  Social History:  reports that she has quit smoking. Her smoking use included cigarettes. She has never used smokeless tobacco. She reports current alcohol use. She reports that she does not use drugs.   Allergies  Allergen Reactions   Pineapple     Tongue swells    History reviewed. No pertinent family history.   Prior to Admission medications   Medication Sig Start Date End Date Taking? Authorizing Provider  cetirizine (ZYRTEC) 10 MG tablet Take 10 mg by mouth daily. Patient not taking: Reported on 01/02/2024    [provider]  citalopram  (CELEXA ) 20 MG tablet Take 1 tablet (20 mg total) by mouth daily with  breakfast. Patient not taking: Reported on 01/02/2024 02/10/22   Arlyne Bering, NP  famotidine  (PEPCID ) 20 MG tablet Take 1 tablet (20 mg total) by mouth 2 (two) times daily. Patient not taking: Reported on 12/19/2018 12/13/18   Henderly, Britni A, PA-C  fluticasone (FLONASE) 50 MCG/ACT nasal spray Place 1-2 sprays into both nostrils 2 (two) times  daily. Patient not taking: Reported on 01/02/2024 10/02/18   [provider]  MECLIZINE HCL PO Take 1 tablet by mouth daily as needed (vertigo). Patient not taking: Reported on 01/02/2024    [provider]  Melatonin 5 MG CAPS Take 1 capsule (5 mg total) by mouth at bedtime as needed. Patient not taking: Reported on 01/02/2024 02/10/22   Arlyne Bering, NP     Objective    Physical Exam: Vitals:   01/01/24 1653 01/01/24 2100 01/02/24 0001 01/02/24 0130  BP: 130/80 (!) 140/78 125/68   Pulse: (!) 106 84 89   Resp: 18 18 18    Temp:  98.8 F (37.1 C)  97.9 F (36.6 C)  TempSrc:  Oral  Oral  SpO2: 95% 100% 100%   Weight:      Height:        General: appears to be stated age; alert, oriented Skin: warm, dry, no rash Head:  AT/[place] Mouth:  Oral mucosa membranes appear dry, normal dentition Neck: supple; trachea midline Heart:  RRR; did not appreciate any M/R/G Lungs: CTAB, did not appreciate any wheezes, rales, or rhonchi Abdomen: + BS; soft, ND, NT Vascular: 2+ pedal pulses b/l; 2+ radial pulses b/l Extremities: no peripheral edema, no muscle wasting; soft compartments of the bilateral lower extremities Neuro: strength and sensation intact in upper and lower extremities b/l    Labs on Admission: I have personally reviewed following labs and imaging studies  CBC: Recent Labs  Lab 01/01/24 1559  WBC 9.4  NEUTROABS 5.9  HGB 15.3*  HCT 47.2*  MCV 90.1  PLT 376   Basic Metabolic Panel: Recent Labs  Lab 01/01/24 1559  NA 138  K 4.3  CL 104  CO2 24  GLUCOSE 98  BUN 12  CREATININE 0.64  CALCIUM 9.7  MG 2.5*   GFR: Estimated Creatinine Clearance: 126.2 mL/min (by C-G formula based on SCr of 0.64 mg/dL). Liver Function Tests: Recent Labs  Lab 01/01/24 1559  AST 124*  ALT 75*  ALKPHOS 55  BILITOT 0.6  PROT 9.0*  ALBUMIN 5.1*   No results for input(s): "LIPASE", "AMYLASE" in the last 168 hours. No results for input(s): "AMMONIA" in the  last 168 hours. Coagulation Profile: No results for input(s): "INR", "PROTIME" in the last 168 hours. Cardiac Enzymes: Recent Labs  Lab 01/01/24 1559  CKTOTAL 5,552*   BNP (last 3 results) No results for input(s): "PROBNP" in the last 8760 hours. HbA1C: No results for input(s): "HGBA1C" in the last 72 hours. CBG: No results for input(s): "GLUCAP" in the last 168 hours. Lipid Profile: No results for input(s): "CHOL", "HDL", "LDLCALC", "TRIG", "CHOLHDL", "LDLDIRECT" in the last 72 hours. Thyroid Function Tests: No results for input(s): "TSH", "T4TOTAL", "FREET4", "T3FREE", "THYROIDAB" in the last 72 hours. Anemia Panel: No results for input(s): "VITAMINB12", "FOLATE", "FERRITIN", "TIBC", "IRON", "RETICCTPCT" in the last 72 hours. Urine analysis:    Component Value Date/Time   COLORURINE STRAW (A) 01/01/2024 1559   APPEARANCEUR CLEAR 01/01/2024 1559   LABSPEC 1.009 01/01/2024 1559   PHURINE 7.0 01/01/2024 1559   GLUCOSEU NEGATIVE 01/01/2024 1559   HGBUR NEGATIVE  01/01/2024 1559   BILIRUBINUR NEGATIVE 01/01/2024 1559   KETONESUR NEGATIVE 01/01/2024 1559   PROTEINUR NEGATIVE 01/01/2024 1559   NITRITE NEGATIVE 01/01/2024 1559   LEUKOCYTESUR NEGATIVE 01/01/2024 1559    Radiological Exams on Admission: VAS US  LOWER EXTREMITY VENOUS (DVT) (ONLY [facility] & [facility]) Result Date: 01/01/2024  Lower Venous DVT Study Patient Name:  Wanda Houston  Date of Exam:   01/01/2024 Medical Rec #: [id number]        Accession #:    [id number] Date of Birth: Apr 27, 2001        Patient Gender: F Patient Age:   18 years Exam Location:  [facility] Procedure:      VAS US  LOWER EXTREMITY VENOUS (DVT) Referring Phys: RILEY RANSOM --------------------------------------------------------------------------------  Indications: Pain.  Risk Factors: None identified. Comparison Study: None. Performing Technologist: Estanislao Heimlich  Examination Guidelines: A complete evaluation includes B-mode imaging, spectral Doppler,  color Doppler, and power Doppler as needed of all accessible portions of each vessel. Bilateral testing is considered an integral part of a complete examination. Limited examinations for reoccurring indications may be performed as noted. The reflux portion of the exam is performed with the patient in reverse Trendelenburg.  +---------+---------------+---------+-----------+----------+--------------+ RIGHT    CompressibilityPhasicitySpontaneityPropertiesThrombus Aging +---------+---------------+---------+-----------+----------+--------------+ CFV      Full           Yes      Yes                                 +---------+---------------+---------+-----------+----------+--------------+ SFJ      Full                                                        +---------+---------------+---------+-----------+----------+--------------+ FV Prox  Full                                                        +---------+---------------+---------+-----------+----------+--------------+ FV Mid   Full                                                        +---------+---------------+---------+-----------+----------+--------------+ FV DistalFull                                                        +---------+---------------+---------+-----------+----------+--------------+ PFV      Full                                                        +---------+---------------+---------+-----------+----------+--------------+ POP      Full           Yes      Yes                                 +---------+---------------+---------+-----------+----------+--------------+  PTV      Full                                                        +---------+---------------+---------+-----------+----------+--------------+ PERO     Full                                                        +---------+---------------+---------+-----------+----------+--------------+    +----+---------------+---------+-----------+----------+--------------+ LEFTCompressibilityPhasicitySpontaneityPropertiesThrombus Aging +----+---------------+---------+-----------+----------+--------------+ CFV Full           Yes      Yes                                 +----+---------------+---------+-----------+----------+--------------+     Summary: RIGHT: - There is no evidence of deep vein thrombosis in the lower extremity.  - No cystic structure found in the popliteal fossa.  LEFT: - No evidence of common femoral vein obstruction.   *See table(s) above for measurements and observations. Electronically signed by Delaney Fearing on 01/01/2024 at 9:27:04 PM.    Final       Assessment/Plan    Principal Problem:   Rhabdomyolysis Active Problems:   Bilateral lower extremity pain   Transaminitis     #) Rhabdomyolysis: Concern for this diagnosis on basis of acute myalgias lateral lower extremities, with elevated CPK.  However, it is acknowledged that her urinalysis does not show evidence of myoglobinuria.  Would classify her rhabdomyolysis as traumatic nature in the setting of particularly vigorous exercise involving the bilateral lower extremities 2 days prior.  Not on any statin medication at home.  Bilateral lower extremities appear neurovascularly intact, with soft compartments, and no clinical/physical exam evidence to suggest compartment syndrome at this time.  Renal function appears to be at baseline.  Close monitoring of ensuing electrolytes, including serum potassium, calcium, and phosphorus levels, while pursuing aggressive IV rehydration, as below.  Status post 2 L of IV fluid in the ED thus far.  Plan: Aggressive IVF's in the form of lactated Ringer 's at 150 cc/h x 12 hours. Close monitoring of ensuing renal function, including repeat CMP in the morning. Monitor strict I&O's. Check phosphorus level. Repeat CPK in the morning. Tele. Check UDS. Check TSH.  As needed Percocet  for moderate pain.  As needed IV fentanyl  for severe pain.                #) Acute transaminitis: Relative to liver enzymes in December 2019, presenting CMP reflects mild elevation in AST/ALT, which appears to be hepatocellular in pattern and likely as a consequence of her suspected presenting mild rhabdomyolysis.  No elevation in alkaline phosphatase or total bilirubin to suggest a cholestatic pattern.  No evidence of abdominal discomfort on exam.   Plan: Further evaluation management of presenting suspected rhabdomyolysis, as above, including aggressive IV fluids.  Check urine drug screen, serum ethanol level.  Check INR.  Repeat CPK in the morning.  Repeat CMP, CBC in the morning.      DVT prophylaxis: Lovenox  40 mg sq qday Code Status: Full code Family Communication: none Disposition Plan: Per Rounding Team Consults called: none;  Admission status: Observation    I SPENT GREATER THAN 75  MINUTES IN CLINICAL CARE TIME/MEDICAL DECISION-MAKING IN COMPLETING THIS ADMISSION.     Wanda Houston B Wanda Menefee DO [facility] From 7PM - 7AM   01/02/2024, 1:37 AM

## 2024-01-03 LAB — BASIC METABOLIC PANEL
Anion gap: 9 (ref 5–15)
Anion gap: 9 (ref 5–15)
BUN: 11 mg/dL (ref 6–20)
BUN: 10 mg/dL (ref 6–20)
CO2: 21 mmol/L — ABNORMAL LOW (ref 22–32)
CO2: 26 mmol/L (ref 22–32)
Calcium: 9 mg/dL (ref 8.9–10.3)
Calcium: 9.6 mg/dL (ref 8.9–10.3)
Chloride: 105 mmol/L (ref 98–111)
Chloride: 103 mmol/L (ref 98–111)
Creatinine, Ser: 0.52 mg/dL (ref 0.44–1.00)
Creatinine, Ser: 0.62 mg/dL (ref 0.44–1.00)
GFR, Estimated: >60 mL/min (ref >60)
GFR, Estimated: >60 mL/min (ref >60)
Glucose, Bld: 101 mg/dL — ABNORMAL HIGH (ref 70–99)
Glucose, Bld: 97 mg/dL (ref 70–99)
Potassium: 4 mmol/L (ref 3.5–5.1)
Potassium: 4 mmol/L (ref 3.5–5.1)
Sodium: 135 mmol/L (ref 135–145)
Sodium: 138 mmol/L (ref 135–145)

## 2024-01-03 LAB — CBC
HCT: 40.4 % (ref 36.0–46.0)
Hemoglobin: 13.4 g/dL (ref 12.0–15.0)
MCH: 29.8 pg (ref 26.0–34.0)
MCHC: 33.2 g/dL (ref 30.0–36.0)
MCV: 90 fL (ref 80.0–100.0)
Platelets: 289 10*3/uL (ref 150–400)
RBC: 4.49 MIL/uL (ref 3.87–5.11)
RDW: 12.2 % (ref 11.5–15.5)
WBC: 6.6 10*3/uL (ref 4.0–10.5)
nRBC: 0 % (ref 0.0–0.2)

## 2024-01-03 LAB — CK
Total CK: 3270 U/L — ABNORMAL HIGH (ref 38–234)
Total CK: 3224 U/L — ABNORMAL HIGH (ref 38–234)

## 2024-01-03 MED ORDER — POLYETHYLENE GLYCOL 3350 17 G PO PACK
17.0000 g | PACK | Freq: Every day | ORAL | Status: DC | PRN
  Administered 2024-01-03: 17 g via ORAL
  Filled 2024-01-03: qty 1

## 2024-01-03 NOTE — Progress Notes (Addendum)
PROGRESS NOTE  Wanda Houston  WUJ:[id number] DOB: 14-Aug-2001 DOA: 01/01/2024 PCP: [place], MD  Consultants  Brief Narrative: Wanda Houston is a 23 y.o. female with no reported significant past medical history who is admitted to [facility] on 01/01/2024 with concern for rhabdomyolysis after presenting from home to [facility] ED complaining of bilateral lower extremity discomfort.  Found to have CK level greater than 5000 and diagnosed with rhabdomyolysis.  Being treated with fluids and oral rehydration.   Assessment & Plan: #) Rhabdomyolysis:  -Secondary to extensive and rather severe sinus/upper respiratory illness last week followed by hard workout on Sunday which she had not been working on for quite some time previously.  Followed by a hot yoga session immediately afterwards and not as much oral hydration as usual.  Rhabdo secondary to this. -CK greater than 5000 on admission. -Downtrending subsequent check to 4300. -Down to 3000 today. -Received fluid bolus in the ER.  Also with ongoing fluids at 150 cc/h.  These were stopped but I will continue them through tomorrow. -Urinalysis normal. -She is young and able to compensate and it is not surprising that her renal function is also normal.  No hypercalcemia or hyperkalemia noted. -Repeat BMP and CK for this afternoon as well. -If continues downtrending will likely be able to discharge home in the morning.    Right calf pain: -Fairly significant calf pain on admission.  It is now improving. - Did hit her calf about a week ago on car door she tells me today and had some bruising there but that resolved and I don't appreciate any bruising today on exam.  -Dopplers negative in the emergency department for DVT.  Much less tender today than yesterday.  Not tense.  I can do fairly deep palpation and she can tolerate this.   -No bruising or redness noted.  Pedal pulses are intact.  No coolness to touch or temperature change noted.    -Obvious concern was compartment syndrome. I did discuss this with her and to let us/nursing staff know immediately if she begins to have any of the above symptoms.  Will also follow CK as above.   - Exam completely reassuring today.  Especially in light of ongoing fluid replacement and fact her calf is now normal (other than some tenderness to deep palpation).  Elevated LFTs: - likely byproduct of muscle breakdown b/c AST and ALT, not bilirubin.  Downtrending.   - Will recheck tomorrow AM to ensure downtrending.      DVT prophylaxis:  enoxaparin (LOVENOX) injection 40 mg Start: 01/02/24 0800  Code Status:   Code Status: Full Code Family Communication: Girlfriend was on the phone on speaker while I was with patient Level of care: Telemetry Status is: Inpatient Remains inpatient appropriate because: Ongoing monitoring the rhabdo.  **Of note patient requires work release to go back to work.  She also would like a note stating that she can use crutches while at work.  She works at [employer] and evidently they are very strict about what she can and cannot have for work accommodations unless there was a note.  Consults called:  Subjective: Pain better today in her calf.  Still present but not nearly as severe as yesterday.  She is now able to bear some weight on that right foot.  Was able to shower earlier.  Otherwise feels well.  Has questions about when she can return back to her job.  Objective: Vitals:   01/02/24 1931 01/03/24 0059 01/03/24  0517 01/03/24 1206  BP: (!) 146/88 (!) 131/57 109/61 112/68  Pulse: 90 71 (!) 54 92  Resp: 18 18 16    Temp: 97.9 F (36.6 C) 97.6 F (36.4 C) 98.2 F (36.8 C) 97.7 F (36.5 C)  TempSrc:  Oral  Oral  SpO2: 100% 99% 99% 97%  Weight:      Height:        Intake/Output Summary (Last 24 hours) at 01/03/2024 1736 Last data filed at 01/02/2024 1805 Gross per 24 hour  Intake 2142.5 ml  Output --  Net 2142.5 ml   Filed Weights   01/01/24 1506   Weight: 88.5 kg   Body mass index is 29.65 kg/m.  Gen: 23 y.o. female in no apparent distress.  Nontoxic Pulm: Non-labored breathing.  Clear to auscultation bilaterally.  CV: Regular rate and rhythm. No murmur, rub, or gallop. No JVD GI: Abdomen soft, non-tender, non-distended, with normoactive bowel sounds. No organomegaly or masses felt. Ext: Warm, no deformities.  Left leg and calf within normal limits.  Right calf with some tenderness mostly in the posterior gastroc region with deep palpation, much less so than yesterday.  No tenseness/tightness to calf.  She is able to dorsiflex now almost completely and plantar flex fully.  Sensation is fully intact calf, tender lower extremity, but throughout.  Pulses remain strong bilaterally and equal.  No color changes to skin. Skin: No rashes, lesions  Neuro: Alert and oriented. No focal neurological deficits. Psych: Calm  Judgement and insight appear normal. Mood & affect appropriate.     I have personally reviewed the following labs and images: CBC: Recent Labs  Lab 01/01/24 1559 01/02/24 0241 01/03/24 0522  WBC 9.4 8.7 6.6  NEUTROABS 5.9 4.6  --   HGB 15.3* 13.1 13.4  HCT 47.2* 40.4 40.4  MCV 90.1 90.2 90.0  PLT 376 311 289   BMP &GFR Recent Labs  Lab 01/01/24 1559 01/02/24 0241 01/02/24 1521 01/03/24 0522 01/03/24 1443  NA 138 138 134* 135 138  K 4.3 3.9 3.8 4.0 4.0  CL 104 104 101 105 103  CO2 24 26 24  21* 26  GLUCOSE 98 106* 91 101* 97  BUN 12 12 11 11 10   CREATININE 0.64 0.70 0.73 0.52 0.62  CALCIUM 9.7 9.3 8.7* 9.0 9.6  MG 2.5* 2.1  --   --   --   PHOS  --  4.1  --   --   --    Estimated Creatinine Clearance: 126.2 mL/min (by C-G formula based on SCr of 0.62 mg/dL). Liver & Pancreas: Recent Labs  Lab 01/01/24 1559 01/02/24 0241  AST 124* 96*  ALT 75* 65*  ALKPHOS 55 53  BILITOT 0.6 0.5  PROT 9.0* 7.0  ALBUMIN 5.1* 4.0   No results for input(s): "LIPASE", "AMYLASE" in the last 168 hours. No results for  input(s): "AMMONIA" in the last 168 hours. Diabetic: No results for input(s): "HGBA1C" in the last 72 hours. No results for input(s): "GLUCAP" in the last 168 hours. Cardiac Enzymes: Recent Labs  Lab 01/01/24 1559 01/02/24 0241 01/02/24 1521 01/03/24 0522 01/03/24 1443  CKTOTAL 5,552* 4,345* 4,510* 3,270* 3,224*   No results for input(s): "PROBNP" in the last 8760 hours. Coagulation Profile: Recent Labs  Lab 01/02/24 0241  INR 1.0   Thyroid Function Tests: Recent Labs    01/02/24 0241  TSH 3.050   Lipid Profile: No results for input(s): "CHOL", "HDL", "LDLCALC", "TRIG", "CHOLHDL", "LDLDIRECT" in the last 72 hours. Anemia Panel:  No results for input(s): "VITAMINB12", "FOLATE", "FERRITIN", "TIBC", "IRON", "RETICCTPCT" in the last 72 hours. Urine analysis:    Component Value Date/Time   COLORURINE STRAW (A) 01/01/2024 1559   APPEARANCEUR CLEAR 01/01/2024 1559   LABSPEC 1.009 01/01/2024 1559   PHURINE 7.0 01/01/2024 1559   GLUCOSEU NEGATIVE 01/01/2024 1559   HGBUR NEGATIVE 01/01/2024 1559   BILIRUBINUR NEGATIVE 01/01/2024 1559   KETONESUR NEGATIVE 01/01/2024 1559   PROTEINUR NEGATIVE 01/01/2024 1559   NITRITE NEGATIVE 01/01/2024 1559   LEUKOCYTESUR NEGATIVE 01/01/2024 1559   Sepsis Labs: Invalid input(s): "PROCALCITONIN", "LACTICIDVEN"  Microbiology: No results found for this or any previous visit (from the past 240 hours).  Radiology Studies: No results found.   Scheduled Meds:  enoxaparin (LOVENOX) injection  40 mg Subcutaneous Q24H   Continuous Infusions:  lactated ringers 150 mL/hr at 01/03/24 1645     LOS: 1 day   35 minutes with more than 50% spent in reviewing records, counseling patient/family and coordinating care.  Tobey Grim, MD [facility] [email or web address] 01/03/2024, 5:36 PM

## 2024-01-03 NOTE — Plan of Care (Signed)

## 2024-01-03 NOTE — Progress Notes (Signed)
   01/03/24 1040  Spiritual Encounters  Type of Visit Initial  Care provided to: Patient  Referral source Other (comment) (Spiritual Consult)  Reason for visit Advance directives  OnCall Visit No   Chaplain responded to a spiritual consult requested advanced directive education. I met with the patient, Wanda Houston who was alert and welcomed me into her space. I went over the documents with her and she is going to work on them later. I advised her that if she had any questions to reach out to her nurse and we would respond.   Valerie Roys [facility]  [phone]

## 2024-01-03 NOTE — Plan of Care (Signed)

## 2024-01-04 LAB — COMPREHENSIVE METABOLIC PANEL
ALT: 72 U/L — ABNORMAL HIGH (ref 0–44)
AST: 82 U/L — ABNORMAL HIGH (ref 15–41)
Albumin: 3.6 g/dL (ref 3.5–5.0)
Alkaline Phosphatase: 44 U/L (ref 38–126)
Anion gap: 9 (ref 5–15)
BUN: 12 mg/dL (ref 6–20)
CO2: 24 mmol/L (ref 22–32)
Calcium: 8.8 mg/dL — ABNORMAL LOW (ref 8.9–10.3)
Chloride: 104 mmol/L (ref 98–111)
Creatinine, Ser: 0.63 mg/dL (ref 0.44–1.00)
GFR, Estimated: >60 mL/min (ref >60)
Glucose, Bld: 99 mg/dL (ref 70–99)
Potassium: 3.9 mmol/L (ref 3.5–5.1)
Sodium: 137 mmol/L (ref 135–145)
Total Bilirubin: 0.6 mg/dL (ref 0.0–1.2)
Total Protein: 6.7 g/dL (ref 6.5–8.1)

## 2024-01-04 LAB — CK: Total CK: 1832 U/L — ABNORMAL HIGH (ref 38–234)

## 2024-01-04 MED ORDER — ACETAMINOPHEN 325 MG PO TABS: 650.0000 mg | ORAL_TABLET | Freq: Four times a day (QID) | ORAL | 0 refills | Status: AC | PRN

## 2024-01-04 MED ORDER — TRAMADOL HCL 50 MG PO TABS: 50.0000 mg | ORAL_TABLET | Freq: Three times a day (TID) | ORAL | 0 refills | Status: AC | PRN

## 2024-01-04 NOTE — Discharge Instructions (Signed)
Please continue to stay well hydrated with oral fluids throughout the day- aim for at least every 3 hours having to urinate.  Gentle stretches are ok, but not strenuous exercise until your symptoms have resolved.  Please follow up with your primary doctor if you are not able to return to work on Monday

## 2024-01-04 NOTE — Discharge Summary (Signed)
Physician Discharge Summary  Patient: Wanda Houston VHQ:[id number] DOB: 10/01/2001   Code Status: Full Code Admit date: 01/01/2024 Discharge date: 01/04/2024 Disposition: Home, No home health services recommended PCP: Harvest Forest, MD  Recommendations for Outpatient Follow-up:  Follow up with PCP within 1-2 weeks Regarding general hospital follow up and preventative care Recommend rechecking CK  Discharge Diagnoses:  Principal Problem:   Rhabdomyolysis Active Problems:   Bilateral lower extremity pain   Transaminitis   Elevated LFTs  Brief Hospital Course Summary: Wanda Houston is a 23 y.o. female with no reported significant past medical history who is admitted to [facility] on 01/01/2024 with concern for rhabdomyolysis after presenting from home complaining of bilateral lower extremity discomfort. Had multiple small traumas and high-intensity workouts in a short period of time proceeding this. Found to have CK level greater than 5000 and diagnosed with rhabdomyolysis.   They received iV fluid hydration and analgesia. He pain improved and able to tolerate ambulation.  CK improved to 1,832 on day of discharge. She can continue oral hydration and discharged in stable condition at this time.   Discharge Condition: Good, improved Recommended discharge diet: Regular healthy diet  Consultations: None   Procedures/Studies: None   Discharge Instructions     Discharge patient   Complete by: As directed    Discharge disposition: 01-Home or Self Care   Discharge patient date: 01/04/2024      Allergies as of 01/04/2024       Reactions   Pineapple    Tongue swells        Medication List     STOP taking these medications    cetirizine 10 MG tablet Commonly known as: ZYRTEC   citalopram 20 MG tablet Commonly known as: CELEXA   famotidine 20 MG tablet Commonly known as: PEPCID   fluticasone 50 MCG/ACT nasal spray Commonly known as: FLONASE    MECLIZINE HCL PO   Melatonin 5 MG Caps       TAKE these medications    acetaminophen 325 MG tablet Commonly known as: TYLENOL Take 2 tablets (650 mg total) by mouth every 6 (six) hours as needed for up to 15 days for mild pain (pain score 1-3) or moderate pain (pain score 4-6) (or Fever >/= 101).   traMADol 50 MG tablet Commonly known as: Ultram Take 1 tablet (50 mg total) by mouth every 8 (eight) hours as needed for up to 3 days.        Follow-up Information     Bakare, Cyndee Brightly, MD. Schedule an appointment as soon as possible for a visit.   Specialty: Internal Medicine Why: As needed Contact information: [address] [place] [address] [phone]                 Subjective   Pt reports feeling much improved. She has been able to tolerate ambulating to and from the bathroom. Continues to have mild R calf pain where she had injury.   All questions and concerns were addressed at time of discharge.  Objective  Blood pressure 106/64, pulse (!) 56, temperature 98.7 F (37.1 C), resp. rate 18, height 5\' 8"  (1.727 m), weight 88.5 kg, SpO2 98%.   General: Pt is alert, awake, not in acute distress Cardiovascular: RRR, S1/S2 +, no rubs, no gallops Respiratory: CTA bilaterally, no wheezing, no rhonchi Abdominal: Soft, NT, ND, bowel sounds + Extremities: no edema, no cyanosis. Mild tenderness to R calf  The results  of significant diagnostics from this hospitalization (including imaging, microbiology, ancillary and laboratory) are listed below for reference.   Imaging studies: VAS [place] LOWER EXTREMITY VENOUS (DVT) (ONLY [facility] & [facility]) Result Date: 01/01/2024  Lower Venous DVT Study Patient Name:  Wanda Houston  Date of Exam:   01/01/2024 Medical Rec #: [id number]        Accession #:    [id number] Date of Birth: July 27, 2001        Patient Gender: F Patient Age:   23 years Exam Location:  [facility] Procedure:      VAS [place] LOWER EXTREMITY VENOUS  (DVT) Referring Phys: RILEY RANSOM --------------------------------------------------------------------------------  Indications: Pain.  Risk Factors: None identified. Comparison Study: None. Performing Technologist: Shona Simpson  Examination Guidelines: A complete evaluation includes B-mode imaging, spectral Doppler, color Doppler, and power Doppler as needed of all accessible portions of each vessel. Bilateral testing is considered an integral part of a complete examination. Limited examinations for reoccurring indications may be performed as noted. The reflux portion of the exam is performed with the patient in reverse Trendelenburg.  +---------+---------------+---------+-----------+----------+--------------+ RIGHT    CompressibilityPhasicitySpontaneityPropertiesThrombus Aging +---------+---------------+---------+-----------+----------+--------------+ CFV      Full           Yes      Yes                                 +---------+---------------+---------+-----------+----------+--------------+ SFJ      Full                                                        +---------+---------------+---------+-----------+----------+--------------+ FV Prox  Full                                                        +---------+---------------+---------+-----------+----------+--------------+ FV Mid   Full                                                        +---------+---------------+---------+-----------+----------+--------------+ FV DistalFull                                                        +---------+---------------+---------+-----------+----------+--------------+ PFV      Full                                                        +---------+---------------+---------+-----------+----------+--------------+ POP      Full           Yes      Yes                                 +---------+---------------+---------+-----------+----------+--------------+  PTV       Full                                                        +---------+---------------+---------+-----------+----------+--------------+ PERO     Full                                                        +---------+---------------+---------+-----------+----------+--------------+   +----+---------------+---------+-----------+----------+--------------+ LEFTCompressibilityPhasicitySpontaneityPropertiesThrombus Aging +----+---------------+---------+-----------+----------+--------------+ CFV Full           Yes      Yes                                 +----+---------------+---------+-----------+----------+--------------+     Summary: RIGHT: - There is no evidence of deep vein thrombosis in the lower extremity.  - No cystic structure found in the popliteal fossa.  LEFT: - No evidence of common femoral vein obstruction.   *See table(s) above for measurements and observations. Electronically signed by Carolynn Sayers on 01/01/2024 at 9:27:04 PM.    Final     Labs: Basic Metabolic Panel: Recent Labs  Lab 01/01/24 1559 01/02/24 0241 01/02/24 1521 01/03/24 0522 01/03/24 1443 01/04/24 0554  NA 138 138 134* 135 138 137  K 4.3 3.9 3.8 4.0 4.0 3.9  CL 104 104 101 105 103 104  CO2 24 26 24  21* 26 24  GLUCOSE 98 106* 91 101* 97 99  BUN 12 12 11 11 10 12   CREATININE 0.64 0.70 0.73 0.52 0.62 0.63  CALCIUM 9.7 9.3 8.7* 9.0 9.6 8.8*  MG 2.5* 2.1  --   --   --   --   PHOS  --  4.1  --   --   --   --    CBC: Recent Labs  Lab 01/01/24 1559 01/02/24 0241 01/03/24 0522  WBC 9.4 8.7 6.6  NEUTROABS 5.9 4.6  --   HGB 15.3* 13.1 13.4  HCT 47.2* 40.4 40.4  MCV 90.1 90.2 90.0  PLT 376 311 289   Microbiology: Results for orders placed or performed in visit on 11/03/19  Novel Coronavirus, NAA ([facility])     Status: None   Collection Time: 11/03/19 12:00 AM   Specimen: Nasopharyngeal(NP) swabs in vial transport medium   NASOPHARYNGE  TESTING  Result Value Ref Range Status    SARS-CoV-2, NAA Not Detected Not Detected Final    Comment: This nucleic acid amplification test was developed and its performance characteristics determined by [employer]. Nucleic acid amplification tests include PCR and TMA. This test has not been FDA cleared or approved. This test has been authorized by FDA under an Emergency Use Authorization (EUA). This test is only authorized for the duration of time the declaration that circumstances exist justifying the authorization of the emergency use of in vitro diagnostic tests for detection of SARS-CoV-2 virus and/or diagnosis of COVID-19 infection under section 564(b)(1) of the Act, 21 U.S.C. 161WRU-0(A) (1), unless the authorization is terminated or revoked sooner. When diagnostic testing is negative, the possibility of a false negative result should be considered in the context of a patient's recent  exposures and the presence of clinical signs and symptoms consistent with COVID-19. An individual without symptoms of COVID-19 and who is not shedding SARS-CoV-2 virus would  expect to have a negative (not detected) result in this assay.    Time coordinating discharge: Over 30 minutes  Leeroy Bock, MD  [facility] 01/04/2024, 9:37 AM

## 2024-01-04 NOTE — Progress Notes (Signed)
   01/04/24 0849  TOC Brief Assessment  Insurance and Status Lapsed  Patient has primary care physician Yes  Home environment has been reviewed Single family home  Prior level of function: Independent  Prior/Current Home Services No current home services  Social Drivers of Health Review SDOH reviewed no interventions necessary  Readmission risk has been reviewed Yes  Transition of care needs no transition of care needs at this time

## 2024-01-08 DIAGNOSIS — J4520 Mild intermittent asthma, uncomplicated: Secondary | ICD-10-CM

## 2024-01-08 HISTORY — DX: Mild intermittent asthma, uncomplicated: J45.20

## 2024-01-09 ENCOUNTER — Other Ambulatory Visit: Payer: Self-pay | Admitting: Family Medicine

## 2024-01-09 DIAGNOSIS — R748 Abnormal levels of other serum enzymes: Secondary | ICD-10-CM

## 2024-01-11 ENCOUNTER — Ambulatory Visit
Admission: RE | Admit: 2024-01-11 | Discharge: 2024-01-11 | Disposition: A | Discharge status: Home / Self Care | Payer: No Typology Code available for payment source | Source: Ambulatory Visit | Attending: Family Medicine | Admitting: Family Medicine

## 2024-01-11 DIAGNOSIS — R748 Abnormal levels of other serum enzymes: Secondary | ICD-10-CM

## 2024-05-20 NOTE — Progress Notes (Unsigned)
 NEW GYNECOLOGY PATIENT Patient name: Wanda Houston MRN [id number]  Date of birth: 05-Nov-2001 Chief Complaint:   No chief complaint on file.     History:  Wanda Houston is a 23 y.o. G0P0000 being seen today for concern for possible endometriosis.  Symptoms: severe cramping, upper abdominal pain , worsened with ovulation and menses. Takes tylenol  and ibuprofen ; will get in the tub, up to 4 hours, to get relief. May skip a month or 2, more regular now than it was previously. Tried nexplanon and estrogen only pills which didn't help. Pad is heavy after first hour and has pain with insertion.   Discussed the use of AI scribe software for clinical note transcription with the patient, who gave verbal consent to proceed.  History of Present Illness Wanda Houston "Wanda Houston" is a 23 year old female who presents with symptoms suggestive of endometriosis.  She has experienced intense menstrual periods since menarche at age 51, with symptoms worsening over the past few years. Her periods are intense and irregular, occurring approximately every 40 days, with occasional skipped periods or two cycles in one month.  Since 2020 or 2021, she has experienced severe bowel-related pain, described as shooting intense pains occurring before or during bowel movements, likened to a lightning bolt. These pains are alleviated after bowel movements. She also reports a firm, distended abdomen that sometimes resolves post-defecation or urination. Her bowel habits have changed from regular to alternating between constipation and diarrhea, with occasional blood in her stool but not in her urine. She has not been evaluated by a gastroenterologist and has not undergone any endoscopic procedures.  She experiences pain similar to gas pain, primarily on the right side, radiating to her back and thighs. The pain is more intense during her period but persists outside of it, albeit less severe. She has tried  Gas-X and laxatives without relief.  She experiences urinary urgency and occasional burning, mimicking UTI symptoms, but without confirmed infections. She uses pads due to pain with tampon insertion, describing it as hitting a wall.  She reports dyspareunia, with pain during and after intercourse, describing a sensation of pressure and feeling like her uterus is going to fall out. No visible bulge or burning at the vaginal entrance has been noted.  She has a history of liver disease and was previously on Nexplanon for contraception, which caused prolonged bleeding. She is currently on naltrexone for appetite suppression related to her liver condition. She has a history of TMJ and frequent headaches.  She has a history of anxiety and depression, recently weaned off medication. No family history of bleeding disorders, blood clots, heart attacks, strokes, or breast cancer.       Gynecologic History Patient's last menstrual period was 05/07/2024.  Last Pap: No results found for: "DIAGPAP", "HPVHIGH", "ADEQPAP" Last Mammogram: n/a Last Colonoscopy: n/a  Obstetric History OB History  Gravida Para Term Preterm AB Living  0 0 0 0 0 0  SAB IAB Ectopic Multiple Live Births  0 0 0 0 0    Past Medical History:  Diagnosis Date   Anxiety    Depression    Epistaxis 07/08/2017   Menorrhagia 07/08/2017   Mild intermittent asthma 01/08/2024   Seasonal allergic rhinitis 07/08/2017   Syncope and collapse 07/08/2017   Vasovagal attack 07/08/2017   Vertigo     No past surgical history on file.  Current Outpatient Medications on File Prior to Visit  Medication Sig Dispense Refill   albuterol (VENTOLIN  HFA) 108 (90 Base) MCG/ACT inhaler Inhale 2 puffs into the lungs every 4 (four) hours as needed.     EPINEPHrine  (EPIPEN  2-PAK) 0.3 mg/0.3 mL IJ SOAJ injection Take 1 auto as needed by injection route, for anaphylactic reaction.     naltrexone (DEPADE) 50 MG tablet Take 1 tablet by mouth daily.      traMADol  (ULTRAM ) 50 MG tablet Take 1 tablet by mouth every 8 (eight) hours as needed.     No current facility-administered medications on file prior to visit.    Allergies  Allergen Reactions   Pineapple     Tongue swells    Social History:  reports that she has quit smoking. Her smoking use included cigarettes. She has never used smokeless tobacco. She reports current alcohol use. She reports that she does not use drugs.  No family history on file.  The following portions of the patient's history were reviewed and updated as appropriate: allergies, current medications, past family history, past medical history, past social history, past surgical history and problem list.  Review of Systems Pertinent items noted in HPI and remainder of comprehensive ROS otherwise negative.  Physical Exam:  BP 118/75   Pulse 93   Wt 204 lb 12.8 oz (92.9 kg)   LMP 05/07/2024   BMI 31.14 kg/m  Physical Exam Vitals and nursing note reviewed.  Constitutional:      Appearance: Normal appearance.  Cardiovascular:     Rate and Rhythm: Normal rate.  Pulmonary:     Effort: Pulmonary effort is normal.     Breath sounds: Normal breath sounds.  Neurological:     General: No focal deficit present.     Mental Status: She is alert and oriented to person, place, and time.  Psychiatric:        Mood and Affect: Mood normal.        Behavior: Behavior normal.        Thought Content: Thought content normal.        Judgment: Judgment normal.        Assessment and Plan:   Assessment & Plan Presumed Endometriosis Chronic pelvic pain, dysmenorrhea, and menorrhagia suggestive of endometriosis. Discussed symptom management and treatment options, including NSAIDs and hormonal suppression. Explained potential side effects of birth control pills and alternative treatments like IUDs. - Order pelvic ultrasound  for evaluation of pelvic structures - Order blood work including thyroid function and A1c. -  Start birth control pills for hormonal suppression. - Schedule follow-up appointment for pelvic exam and Pap smear. - Advise taking ibuprofen  before or after pelvic ultrasound and exam to manage discomfort.  Possible Irritable Bowel Syndrome (IBS) Symptoms of alternating constipation and diarrhea, abdominal pain, and bloating. Discussed possibility of IBS coexisting with endometriosis and need to monitor for triggers. - Monitor dietary triggers and bowel patterns. - Consider IBS management strategies if symptoms persist.  General Health Maintenance Discussed importance of regular health screenings and management of chronic conditions. - Schedule follow-up appointment after starting birth control pills. - Ensure follow-up for pelvic exam and Pap smear.    Routine preventative health maintenance measures emphasized. Please refer to After Visit Summary for other counseling recommendations.   Follow-up: No follow-ups on file.      Kiki Pelton, MD Obstetrician & Gynecologist, Faculty Practice [facility] for [employer], [facility]

## 2024-05-21 ENCOUNTER — Other Ambulatory Visit: Payer: Self-pay

## 2024-05-21 ENCOUNTER — Encounter: Payer: Self-pay | Admitting: Obstetrics and Gynecology

## 2024-05-21 ENCOUNTER — Ambulatory Visit (INDEPENDENT_AMBULATORY_CARE_PROVIDER_SITE_OTHER): Payer: Self-pay | Admitting: Obstetrics and Gynecology

## 2024-05-21 VITALS — BP 118/75 | HR 93 | Wt 204.8 lb

## 2024-05-21 DIAGNOSIS — G8929 Other chronic pain: Secondary | ICD-10-CM

## 2024-05-21 DIAGNOSIS — Z1331 Encounter for screening for depression: Secondary | ICD-10-CM

## 2024-05-21 DIAGNOSIS — N946 Dysmenorrhea, unspecified: Secondary | ICD-10-CM

## 2024-05-21 DIAGNOSIS — N941 Unspecified dyspareunia: Secondary | ICD-10-CM

## 2024-05-21 DIAGNOSIS — N939 Abnormal uterine and vaginal bleeding, unspecified: Secondary | ICD-10-CM

## 2024-05-21 DIAGNOSIS — R102 Pelvic and perineal pain: Secondary | ICD-10-CM

## 2024-05-21 MED ORDER — DROSPIRENONE-ETHINYL ESTRADIOL 3-0.02 MG PO TABS: 1.0000 | ORAL_TABLET | Freq: Every day | ORAL | 11 refills | Status: AC

## 2024-05-22 ENCOUNTER — Ambulatory Visit: Payer: Self-pay | Admitting: Obstetrics and Gynecology

## 2024-05-22 LAB — VON WILLEBRAND PANEL
Factor VIII Activity: 88 % (ref 56–140)
Von Willebrand Ag: 68 % (ref 50–200)
Von Willebrand Factor: 56 % (ref 50–200)

## 2024-05-22 LAB — HEMOGLOBIN A1C
Est. average glucose Bld gHb Est-mCnc: 114 mg/dL
Hgb A1c MFr Bld: 5.6 % (ref 4.8–5.6)

## 2024-05-22 LAB — CBC
Hematocrit: 43.1 % (ref 34.0–46.6)
Hemoglobin: 14.5 g/dL (ref 11.1–15.9)
MCH: 29.4 pg (ref 26.6–33.0)
MCHC: 33.6 g/dL (ref 31.5–35.7)
MCV: 87 fL (ref 79–97)
Platelets: 352 10*3/uL (ref 150–450)
RBC: 4.93 x10E6/uL (ref 3.77–5.28)
RDW: 12.7 % (ref 11.7–15.4)
WBC: 7.2 10*3/uL (ref 3.4–10.8)

## 2024-05-22 LAB — TESTOSTERONE,FREE AND TOTAL
Testosterone, Free: 0.8 pg/mL (ref 0.0–4.2)
Testosterone: 31 ng/dL (ref 13–71)

## 2024-05-22 LAB — TSH RFX ON ABNORMAL TO FREE T4: TSH: 0.712 u[IU]/mL (ref 0.450–4.500)

## 2024-05-22 LAB — COAG STUDIES INTERP REPORT

## 2024-05-22 LAB — FOLLICLE STIMULATING HORMONE: FSH: 5.2 m[IU]/mL

## 2024-05-22 LAB — PROLACTIN: Prolactin: 14.8 ng/mL (ref 4.8–33.4)

## 2024-05-28 ENCOUNTER — Other Ambulatory Visit: Payer: Self-pay | Admitting: *Deleted

## 2024-05-28 ENCOUNTER — Other Ambulatory Visit: Payer: Self-pay

## 2024-05-28 DIAGNOSIS — Z124 Encounter for screening for malignant neoplasm of cervix: Secondary | ICD-10-CM

## 2024-05-28 NOTE — Patient Instructions (Signed)
 Wanda Houston

## 2024-05-28 NOTE — Progress Notes (Signed)
 Patient: Wanda Houston           Date of Birth: 30-Dec-2000           MRN: [id number] Visit Date: 05/28/2024 PCP: Nohemi Batters, MD  Cervical Cancer Screening Do you smoke?: Yes Have you ever had or been told you have an allergy to latex products?: No Marital status: Single Date of last pap smear:  (never) Date of last menstrual period: 05/08/24 Number of pregnancies: 0 Number of births: 0 Have you ever had any of the following? Hysterectomy: No Tubal ligation (tubes tied): No Abnormal bleeding: No Abnormal pap smear: No Venereal warts: No A sex partner with venereal warts: No A high risk* sex partner: No  Cervical Exam  Abnormal Observations: Normal Exam. Recommendations: Per patient has never had a Pap smear completed. No Pap smear results are available in EPIC. Let patient know if today's Pap smear is normal that her next Pap smear will be due in three years. Informed patient will follow up with her within the next couple of weeks with results of her Pap smear by letter or phone. Patient verbalized understanding.       Patient's History Patient Active Problem List   Diagnosis Date Noted   Rhabdomyolysis 01/02/2024   Bilateral lower extremity pain 01/02/2024   Transaminitis 01/02/2024   Elevated LFTs 01/02/2024   Anxiety disorder 07/08/2017   Past Medical History:  Diagnosis Date   Anxiety    Depression    Epistaxis 07/08/2017   Menorrhagia 07/08/2017   Mild intermittent asthma 01/08/2024   Seasonal allergic rhinitis 07/08/2017   Syncope and collapse 07/08/2017   Vasovagal attack 07/08/2017   Vertigo     No family history on file.  Social History   Occupational History   Not on file  Tobacco Use   Smoking status: Former    Current packs/day: 0.25    Types: Cigarettes   Smokeless tobacco: Never   Tobacco comments:    Quick smoking in Nov '24  Vaping Use   Vaping status: Every Day  Substance and Sexual Activity   Alcohol use: Yes     Comment: occ   Drug use: No   Sexual activity: Yes    Birth control/protection: Implant

## 2024-06-03 ENCOUNTER — Ambulatory Visit: Payer: Self-pay

## 2024-07-03 ENCOUNTER — Ambulatory Visit (INDEPENDENT_AMBULATORY_CARE_PROVIDER_SITE_OTHER): Payer: Self-pay | Admitting: Obstetrics and Gynecology

## 2024-07-03 ENCOUNTER — Other Ambulatory Visit: Payer: Self-pay

## 2024-07-03 VITALS — BP 130/84 | HR 98 | Wt 204.0 lb

## 2024-07-03 DIAGNOSIS — N939 Abnormal uterine and vaginal bleeding, unspecified: Secondary | ICD-10-CM

## 2024-07-03 DIAGNOSIS — N941 Unspecified dyspareunia: Secondary | ICD-10-CM

## 2024-07-03 DIAGNOSIS — N946 Dysmenorrhea, unspecified: Secondary | ICD-10-CM

## 2024-07-03 DIAGNOSIS — Z1331 Encounter for screening for depression: Secondary | ICD-10-CM

## 2024-07-03 MED ORDER — BACLOFEN 10 MG PO TABS: 10.0000 mg | ORAL_TABLET | Freq: Two times a day (BID) | ORAL | 3 refills | Status: DC | PRN

## 2024-07-03 NOTE — Progress Notes (Signed)
 GYNECOLOGY VISIT  Patient name: Wanda Houston MRN [id number]  Date of birth: 2001/07/26 Chief Complaint:   Follow-up   History:  Wanda Houston is a 23 y.o. G0P0000 being seen today for follow up. Forgot to pick up birth control, therefore has not started it. Pap smear completed at a different office - did not necessarily have pain with initial insertion. Previosly when discussed, noted to have pain deep within vagina. Has been doing kegels. Currently uninsured and does not qualify for medicaid.   Later inquired about potential contributions of prior trauma on current pain.   Past Medical History:  Diagnosis Date   Anxiety    Depression    Epistaxis 07/08/2017   Menorrhagia 07/08/2017   Mild intermittent asthma 01/08/2024   Seasonal allergic rhinitis 07/08/2017   Syncope and collapse 07/08/2017   Vasovagal attack 07/08/2017   Vertigo     No past surgical history on file.  The following portions of the patient's history were reviewed and updated as appropriate: allergies, current medications, past family history, past medical history, past social history, past surgical history and problem list.   Health Maintenance:   Last pap     Component Value Date/Time   DIAGPAP  05/28/2024 1344    - Negative for intraepithelial lesion or malignancy (NILM)   ADEQPAP  05/28/2024 1344    Satisfactory for evaluation; transformation zone component PRESENT.  Last mammogram: n/a   Review of Systems:  Pertinent items are noted in HPI. Comprehensive review of systems was otherwise negative.   Objective:  Physical Exam BP 130/84   Pulse 98   Wt 204 lb (92.5 kg)   LMP 06/09/2024   BMI 31.02 kg/m    Physical Exam Vitals and nursing note reviewed. Exam conducted with a chaperone present.  Constitutional:      Appearance: Normal appearance.  HENT:     Head: Normocephalic and atraumatic.  Pulmonary:     Effort: Pulmonary effort is normal.     Breath sounds:  Normal breath sounds.  Abdominal:     Comments: + [place] bilaterally, Left > right   Genitourinary:    General: Normal vulva.     Exam position: Lithotomy position.     Vagina: Normal.     Cervix: Normal.     Comments: Normal appearing vulva Normal vulvar sensation bilaterally Nontender superficial pelvic floor muscles Tender ischial tuberosities bilaterally  Allodynia at introitus: Yes: 7-11 o'clock  Posterior vaginal wall tender Right levator ani 7/10 Right ischiococcygeous 7/10 Right obturator internus 8/10 Left levator ani 6/10 Left ischioccocygeous 9/10 Left obturator internus 7/10 Anterior vaginal wall tender Uterine tender Adnexa tender   Skin:    General: Skin is warm and dry.  Neurological:     General: No focal deficit present.     Mental Status: She is alert.  Psychiatric:        Mood and Affect: Mood normal.        Behavior: Behavior normal.        Thought Content: Thought content normal.        Judgment: Judgment normal.        Assessment & Plan:   1. Dysmenorrhea (Primary) Trial of muscle relaxer for pain. Noted that medication may cause drowsiness.  - baclofen  (LIORESAL ) 10 MG tablet; Take 1 tablet (10 mg total) by mouth 2 (two) times daily as needed for muscle spasms.  Dispense: 60 each; Refill: 3 - Ambulatory referral to Physical Therapy  2. Dyspareunia in  female Referral to PFPT for dyspareunia and pelvic mylagia noted on examination. Discussed that myalgia typically in response to other pain stimuli which can include prior trauma. Currently un-insured and does not qualify for medicaid - can use online resources and hopefully at least a session with PFPT and obtain HEP. Recommend against kegals.  - baclofen  (LIORESAL ) 10 MG tablet; Take 1 tablet (10 mg total) by mouth 2 (two) times daily as needed for muscle spasms.  Dispense: 60 each; Refill: 3  3. Abnormal uterine bleeding (AUB) Will pick up previously prescribed OCP and will assess response.  Ideally will achieve menstrual control.  - baclofen  (LIORESAL ) 10 MG tablet; Take 1 tablet (10 mg total) by mouth 2 (two) times daily as needed for muscle spasms.  Dispense: 60 each; Refill: 3 - Ambulatory referral to Physical Therapy   Routine preventative health maintenance measures emphasized.  Carter Quarry, MD [facility] for [facility], [facility]

## 2024-07-09 ENCOUNTER — Ambulatory Visit (HOSPITAL_COMMUNITY)
Admission: RE | Admit: 2024-07-09 | Discharge: 2024-07-09 | Disposition: A | Discharge status: Home / Self Care | Payer: Self-pay | Source: Ambulatory Visit | Attending: Obstetrics and Gynecology | Admitting: Obstetrics and Gynecology

## 2024-07-09 DIAGNOSIS — N939 Abnormal uterine and vaginal bleeding, unspecified: Secondary | ICD-10-CM | POA: Insufficient documentation

## 2024-07-09 DIAGNOSIS — N941 Unspecified dyspareunia: Secondary | ICD-10-CM | POA: Insufficient documentation

## 2024-07-09 DIAGNOSIS — N946 Dysmenorrhea, unspecified: Secondary | ICD-10-CM | POA: Insufficient documentation

## 2024-07-09 DIAGNOSIS — R102 Pelvic and perineal pain: Secondary | ICD-10-CM | POA: Insufficient documentation

## 2024-07-09 DIAGNOSIS — G8929 Other chronic pain: Secondary | ICD-10-CM | POA: Insufficient documentation

## 2024-07-14 ENCOUNTER — Telehealth: Payer: Self-pay

## 2024-07-14 NOTE — Telephone Encounter (Signed)
 Pt called requested results of her US .     Called pt and pt requested results of US .  I informed pt that her US  has not been reviewed by the radiologist.   I advised pt that once the results are read and then reviewed by the provider we would call with abnormal results.  Pt verbalized understanding.  Shavette Shoaff,RN  07/14/24

## 2024-08-07 ENCOUNTER — Ambulatory Visit: Payer: Self-pay | Attending: Obstetrics and Gynecology

## 2024-08-07 ENCOUNTER — Telehealth: Payer: Self-pay

## 2024-08-07 NOTE — Therapy (Incomplete)
 OUTPATIENT PHYSICAL THERAPY FEMALE PELVIC EVALUATION   Patient Name: Wanda Houston MRN: [id number] DOB:06/28/2001, 23 y.o., female Today's Date: 08/07/2024  END OF SESSION:   Past Medical History:  Diagnosis Date   Anxiety    Depression    Epistaxis 07/08/2017   Menorrhagia 07/08/2017   Mild intermittent asthma 01/08/2024   Seasonal allergic rhinitis 07/08/2017   Syncope and collapse 07/08/2017   Vasovagal attack 07/08/2017   Vertigo    No past surgical history on file. Patient Active Problem List   Diagnosis Date Noted   Rhabdomyolysis 01/02/2024   Bilateral lower extremity pain 01/02/2024   Transaminitis 01/02/2024   Elevated LFTs 01/02/2024   Anxiety disorder 07/08/2017    PCP: Roanna Ezekiel NOVAK, MD  REFERRING PROVIDER: Jeralyn Crutch, MD   REFERRING DIAG: N94.6 (ICD-10-CM) - Dysmenorrhea N93.9 (ICD-10-CM) - Abnormal uterine bleeding (AUB)  THERAPY DIAG:  No diagnosis found.  Rationale for Evaluation and Treatment: Rehabilitation  ONSET DATE: ***  SUBJECTIVE:                                                                                                                                                                                           SUBJECTIVE STATEMENT: ***   PAIN:  Are you having pain? {yes/no:20286} NPRS scale: ***/10 Pain location: {pelvic pain location:27098}  Pain type: {type:313116} Pain description: {PAIN DESCRIPTION:21022940}   Aggravating factors: *** Relieving factors: ***  PRECAUTIONS: None  RED FLAGS: None   WEIGHT BEARING RESTRICTIONS: No  FALLS:  Has patient fallen in last 6 months? No  OCCUPATION: ***  ACTIVITY LEVEL : ***  PLOF: Independent  PATIENT GOALS: ***  PERTINENT HISTORY:  Anxiety, depression, menorrhagia, asthma, syncope and collapse/vasovagal attach Sexual abuse: {Yes/No:304960894}  BOWEL MOVEMENT: Pain with bowel movement: {yes/no:20286} Type of bowel movement:{PT BM  type:27100} Fully empty rectum: {No/Yes:304960894} Leakage: {Yes/No:304960894} Pads: {Yes/No:304960894} Fiber supplement/laxative {YES/NO AS:20300}  URINATION: Pain with urination: {yes/no:20286} Fully empty bladder: {Yes/No:304960894} Stream: {PT urination:27102} Urgency: {YES/NO AS:20300} Frequency: *** Fluid Intake: *** Leakage: {PT leakage:27103} Pads: {Yes/No:304960894}  INTERCOURSE:  Ability to have vaginal penetration {YES/NO:21197} Pain with intercourse: {pain with intercourse PA:27099} Dryness{YES/NO AS:20300} Climax: *** Marinoff Scale: ***/3 Lubricant:  PREGNANCY: Vaginal deliveries *** Tearing {Yes***/No:304960894} Episiotomy {YES/NO AS:20300} C-section deliveries *** Currently pregnant {Yes***/No:304960894}  PROLAPSE: {PT prolapse:27101}   OBJECTIVE:  Note: Objective measures were completed at Evaluation unless otherwise noted.  08/07/24: PATIENT SURVEYS:   PFIQ-7: ***  COGNITION: Overall cognitive status: Within functional limits for tasks assessed     SENSATION: Light touch: Appears intact   FUNCTIONAL TESTS:  Squat: Single leg stance:  Rt:  Lt: Curl-up test:  GAIT: Assistive device utilized: {Assistive devices:23999} Comments: ***  POSTURE: {posture:25561}   LUMBARAROM/PROM:  A/PROM A/PROM  Eval (% available)  Flexion   Extension   Right lateral flexion   Left lateral flexion   Right rotation   Left rotation    (Blank rows = not tested)  PALPATION:   General: ***  Pelvic Alignment: ***  Abdominal: ***                External Perineal Exam: ***                             Internal Pelvic Floor: ***  Patient confirms identification and approves PT to assess internal pelvic floor and treatment {yes/no:20286}  PELVIC MMT:   MMT eval  Vaginal   Internal Anal Sphincter   External Anal Sphincter   Puborectalis   Diastasis Recti   (Blank rows = not tested)        TONE: ***  PROLAPSE: ***  TODAY'S  TREATMENT:                                                                                                                              DATE:  08/07/24 EVAL  Manual:  Neuromuscular re-education:  Exercises:  Therapeutic activities:     PATIENT EDUCATION:  Education details: See above Person educated: Patient Education method: Explanation, Demonstration, Tactile cues, Verbal cues, and Handouts Education comprehension: verbalized understanding  HOME EXERCISE PROGRAM: ***  ASSESSMENT:  CLINICAL IMPRESSION: Patient is a *** y.o. *** who was seen today for physical therapy evaluation and treatment for ***.   OBJECTIVE IMPAIRMENTS: decreased activity tolerance, decreased coordination, decreased endurance, decreased mobility, decreased ROM, decreased strength, increased fascial restrictions, increased muscle spasms, impaired flexibility, impaired tone, improper body mechanics, postural dysfunction, and pain.   ACTIVITY LIMITATIONS: {activitylimitations:27494}  PARTICIPATION LIMITATIONS: {participationrestrictions:25113}  PERSONAL FACTORS: {Personal factors:25162} are also affecting patient's functional outcome.   REHAB POTENTIAL: {rehabpotential:25112}  CLINICAL DECISION MAKING: {clinical decision making:25114}  EVALUATION COMPLEXITY: {Evaluation complexity:25115}   GOALS: Goals reviewed with patient? {yes/no:20286}  SHORT TERM GOALS: Target date: ***  Pt will be independent with HEP in order to improve activity tolerance.   Baseline: Goal status: INITIAL  2.  ***  Baseline:  Goal status: INITIAL  3.  ***  Baseline:  Goal status: INITIAL  4.  *** Baseline:  Goal status: INITIAL  5.  *** Baseline:  Goal status: INITIAL  6.  *** Baseline:  Goal status: INITIAL  LONG TERM GOALS: Target date: ***  Pt will be independent with advanced HEP in order to improve activity tolerance.   Baseline:  Goal status: INITIAL  2.  *** Baseline:  Goal status:  INITIAL  3.  *** Baseline:  Goal status: INITIAL  4.  *** Baseline:  Goal status: INITIAL  5.  *** Baseline:  Goal status: INITIAL  6.  *** Baseline:  Goal status:  INITIAL  PLAN:  PT FREQUENCY: 1-2x/week  PT DURATION: ***   PLANNED INTERVENTIONS: 02835- PT Re-evaluation, 97110-Therapeutic exercises, 97530- Therapeutic activity, 97112- Neuromuscular re-education, 97535- Self Care, 02859- Manual therapy, [phone]- Gait training, [phone]- Aquatic Therapy, [phone]- Electrical stimulation (unattended), [phone]- Traction (mechanical), [phone]- Ionotophoresis 4mg /ml Dexamethasone, 79439 (1-2 muscles), 20561 (3+ muscles)- Dry Needling, Patient/Family education, Balance training, Taping, Joint mobilization, Joint manipulation, Spinal manipulation, Spinal mobilization, Scar mobilization, Vestibular training, Cryotherapy, Moist heat, and Biofeedback  PLAN FOR NEXT SESSION: ***  Josette Mares, PT, DPT08/21/257:48 AM

## 2024-08-07 NOTE — Telephone Encounter (Signed)
 Called and left message after no show for pelvic floor physical therapy evaluation. Asked to call if she would like to reschedule.

## 2024-10-03 ENCOUNTER — Other Ambulatory Visit: Payer: Self-pay

## 2024-10-03 ENCOUNTER — Emergency Department (HOSPITAL_BASED_OUTPATIENT_CLINIC_OR_DEPARTMENT_OTHER)
Admission: EM | Admit: 2024-10-03 | Discharge: 2024-10-03 | Disposition: A | Discharge status: Home / Self Care | Payer: Self-pay | Attending: Emergency Medicine | Admitting: Emergency Medicine

## 2024-10-03 ENCOUNTER — Encounter (HOSPITAL_BASED_OUTPATIENT_CLINIC_OR_DEPARTMENT_OTHER): Payer: Self-pay | Admitting: *Deleted

## 2024-10-03 ENCOUNTER — Emergency Department (HOSPITAL_BASED_OUTPATIENT_CLINIC_OR_DEPARTMENT_OTHER): Payer: Self-pay | Admitting: Radiology

## 2024-10-03 DIAGNOSIS — R0789 Other chest pain: Secondary | ICD-10-CM | POA: Insufficient documentation

## 2024-10-03 MED ORDER — NAPROXEN 500 MG PO TABS: 500.0000 mg | ORAL_TABLET | Freq: Two times a day (BID) | ORAL | 0 refills | Status: AC

## 2024-10-03 NOTE — Discharge Instructions (Signed)
 You were seen today for chest pain.  Your workup is reassuring.  Trial naproxen  as this is likely chest wall pain.

## 2024-10-03 NOTE — ED Triage Notes (Addendum)
 Pt states that she aspirated on her salvia and started coughing. States she felt a pop in the left side of her chest. Describes pain as tight, cramping, that is constant. Worse with a deep breath. Pt states that she was shaking during this episode. Pt states she feels anxious at this time.

## 2024-10-03 NOTE — ED Provider Notes (Signed)
 [place] EMERGENCY DEPARTMENT AT [facility] Provider Note   CSN: [id number] Arrival date & time: 10/03/24  9747     Patient presents with: Aspiration and Chest Pain   Wanda Houston is a 22 y.o. female.   HPI     This is a 23 year old female who presents with chest pain.  Patient reports that she was lying in bed when she coughed and noted a pop in the left side of her chest.  She began to have some discomfort and it radiated into the left arm.  She describes it as tight and worse with deep breathing.  No history of blood clots.  Felt fine before coughing.  No recent URI or fevers.  Patient states that she did feel anxious about her symptoms but this felt different than a panic attack.  Prior to Admission medications   Medication Sig Start Date End Date Taking? Authorizing Provider  baclofen  (LIORESAL ) 10 MG tablet Take 10 mg by mouth 3 (three) times daily.   Yes [provider]  naproxen  (NAPROSYN ) 500 MG tablet Take 1 tablet (500 mg total) by mouth 2 (two) times daily. 10/03/24  Yes Evelyne Makepeace, Charmaine FALCON, MD  albuterol (VENTOLIN HFA) 108 (90 Base) MCG/ACT inhaler Inhale 2 puffs into the lungs every 4 (four) hours as needed. 01/07/24   [provider]  drospirenone -ethinyl estradiol  (YAZ) 3-0.02 MG tablet Take 1 tablet by mouth daily. Patient not taking: Reported on 07/03/2024 05/21/24   Ajewole, Christana, MD  EPINEPHrine  (EPIPEN  2-PAK) 0.3 mg/0.3 mL IJ SOAJ injection Take 1 auto as needed by injection route, for anaphylactic reaction. Patient not taking: Reported on 07/03/2024 01/16/24   [provider]  naltrexone (DEPADE) 50 MG tablet Take 1 tablet by mouth daily. 03/25/24   [provider]    Allergies: Pineapple and Strawberry leaves extract    Review of Systems  Constitutional:  Negative for fever.  Respiratory:  Positive for cough and shortness of breath.   Cardiovascular:  Positive for chest pain.  All other systems reviewed  and are negative.   Updated Vital Signs BP 128/82   Pulse 99   Temp 98.2 F (36.8 C) (Oral)   Resp (!) 22   Ht 1.753 m (5' 9)   Wt 89.8 kg   LMP 09/22/2024 (Approximate)   SpO2 99%   BMI 29.24 kg/m   Physical Exam Vitals and nursing note reviewed.  Constitutional:      Appearance: She is well-developed. She is obese. She is not ill-appearing.  HENT:     Head: Normocephalic and atraumatic.  Eyes:     Pupils: Pupils are equal, round, and reactive to light.  Cardiovascular:     Rate and Rhythm: Normal rate and regular rhythm.     Heart sounds: Normal heart sounds.  Pulmonary:     Effort: Pulmonary effort is normal. No respiratory distress.     Breath sounds: No wheezing.  Chest:     Chest wall: Tenderness present.  Abdominal:     General: Bowel sounds are normal.     Palpations: Abdomen is soft.     Tenderness: There is no guarding or rebound.  Musculoskeletal:     Cervical back: Neck supple.  Skin:    General: Skin is warm and dry.  Neurological:     Mental Status: She is alert and oriented to person, place, and time.  Psychiatric:     Comments: Anxious appearing     (all labs ordered are listed, but  only abnormal results are displayed) Labs Reviewed - No data to display  EKG: EKG Interpretation Date/Time:  Friday October 03 2024 04:18:48 EDT Ventricular Rate:  93 PR Interval:  162 QRS Duration:  90 QT Interval:  369 QTC Calculation: 459 R Axis:   70  Text Interpretation: Sinus rhythm Confirmed by Bari Pfeiffer ([id number]) on 10/03/2024 5:05:53 AM  Radiology: [facility] Chest 2 View Result Date: 10/03/2024 EXAM: 2 VIEW(S) XRAY OF THE CHEST 10/03/2024 03:44:25 AM COMPARISON: None available. CLINICAL HISTORY: CP. Table formatting from the original note was not included.; Pt states that she aspirated on her salvia and started coughing. States she felt a pop in the left side of her chest. Describes pain as tight, cramping, that is constant. Worse with a deep  breath. Pt states that she was shaking during this episode. Pt ; states she feels anxious at this time. FINDINGS: LUNGS AND PLEURA: No focal pulmonary opacity. No pulmonary edema. No pleural effusion. No pneumothorax. HEART AND MEDIASTINUM: No acute abnormality of the cardiac and mediastinal silhouettes. BONES AND SOFT TISSUES: No acute osseous abnormality. IMPRESSION: 1. No acute cardiopulmonary process. Electronically signed by: Pinkie Pebbles MD 10/03/2024 03:53 AM EDT RP Workstation: [id number]     Procedures   Medications Ordered in the ED - No data to display                                  Medical Decision Making Amount and/or Complexity of Data Reviewed Radiology: ordered.  Risk Prescription drug management.   This patient presents to the ED for concern of chest pain, this involves an extensive number of treatment options, and is a complaint that carries with it a high risk of complications and morbidity.  I considered the following differential and admission for this acute, potentially life threatening condition.  The differential diagnosis includes ACS, PE, pneumothorax, chest wall discomfort, pneumonia  MDM:    This is a 23 year old female who presents with acute onset chest pain after coughing.  She is nontoxic.  She is anxious appearing.  She is not hypoxic or tachycardic.  She has reproducible chest wall pain.  No recent URI symptoms.  Chest x-ray without pneumothorax or pneumonia.  EKG without acute ischemic changes.  Given reproducible symptoms of pain and onset after cough, lower suspicion for PE.  Improved with naproxen .  Will trial NSAIDs for likely chest wall discomfort.  (Labs, imaging, consults)  Labs: I Ordered, and personally interpreted labs.  The pertinent results include: N/A  Imaging Studies ordered: I ordered imaging studies including chest x-ray I independently visualized and interpreted imaging. I agree with the radiologist  interpretation  Additional history obtained from chart review.  External records from outside source obtained and reviewed including prior evaluations  Cardiac Monitoring: The patient was maintained on a cardiac monitor.  If on the cardiac monitor, I personally viewed and interpreted the cardiac monitored which showed an underlying rhythm of: Sinus  Reevaluation: After the interventions noted above, I reevaluated the patient and found that they have :improved  Social Determinants of Health:  lives independently  Disposition: Discharge  Co morbidities that complicate the patient evaluation  Past Medical History:  Diagnosis Date   Anxiety    Depression    Epistaxis 07/08/2017   Menorrhagia 07/08/2017   Mild intermittent asthma 01/08/2024   Seasonal allergic rhinitis 07/08/2017   Syncope and collapse 07/08/2017   Vasovagal attack 07/08/2017   Vertigo  Medicines Meds ordered this encounter  Medications   naproxen  (NAPROSYN ) 500 MG tablet    Sig: Take 1 tablet (500 mg total) by mouth 2 (two) times daily.    Dispense:  30 tablet    Refill:  0    I have reviewed the patients home medicines and have made adjustments as needed  Problem List / ED Course: Problem List Items Addressed This Visit   None Visit Diagnoses       Atypical chest pain    -  Primary                Final diagnoses:  Atypical chest pain    ED Discharge Orders          Ordered    naproxen  (NAPROSYN ) 500 MG tablet  2 times daily        10/03/24 0504               Delina Kruczek, Charmaine FALCON, MD 10/03/24 [phone]
